# Patient Record
Sex: Male | Born: 1945 | Race: White | Hispanic: No | Smoking: Current some day smoker
Health system: Southern US, Community
[De-identification: ages and names within clinical notes are randomized; demographics above are authoritative.]

## PROBLEM LIST (undated history)

## (undated) DIAGNOSIS — H269 Unspecified cataract: Secondary | ICD-10-CM

## (undated) DIAGNOSIS — C801 Malignant (primary) neoplasm, unspecified: Secondary | ICD-10-CM

## (undated) DIAGNOSIS — M1990 Unspecified osteoarthritis, unspecified site: Secondary | ICD-10-CM

## (undated) DIAGNOSIS — F0390 Unspecified dementia without behavioral disturbance: Secondary | ICD-10-CM

## (undated) DIAGNOSIS — I10 Essential (primary) hypertension: Secondary | ICD-10-CM

## (undated) DIAGNOSIS — M810 Age-related osteoporosis without current pathological fracture: Secondary | ICD-10-CM

## (undated) DIAGNOSIS — K649 Unspecified hemorrhoids: Secondary | ICD-10-CM

## (undated) DIAGNOSIS — K219 Gastro-esophageal reflux disease without esophagitis: Secondary | ICD-10-CM

## (undated) DIAGNOSIS — G43909 Migraine, unspecified, not intractable, without status migrainosus: Secondary | ICD-10-CM

## (undated) DIAGNOSIS — F32A Depression, unspecified: Secondary | ICD-10-CM

## (undated) DIAGNOSIS — E785 Hyperlipidemia, unspecified: Secondary | ICD-10-CM

## (undated) DIAGNOSIS — F329 Major depressive disorder, single episode, unspecified: Secondary | ICD-10-CM

## (undated) DIAGNOSIS — F419 Anxiety disorder, unspecified: Secondary | ICD-10-CM

## (undated) DIAGNOSIS — E119 Type 2 diabetes mellitus without complications: Secondary | ICD-10-CM

## (undated) HISTORY — DX: Migraine, unspecified, not intractable, without status migrainosus: G43.909

## (undated) HISTORY — DX: Type 2 diabetes mellitus without complications: E11.9

## (undated) HISTORY — DX: Anxiety disorder, unspecified: F41.9

## (undated) HISTORY — DX: Unspecified osteoarthritis, unspecified site: M19.90

## (undated) HISTORY — DX: Unspecified cataract: H26.9

## (undated) HISTORY — DX: Depression, unspecified: F32.A

## (undated) HISTORY — DX: Gastro-esophageal reflux disease without esophagitis: K21.9

## (undated) HISTORY — DX: Malignant (primary) neoplasm, unspecified: C80.1

## (undated) HISTORY — DX: Unspecified hemorrhoids: K64.9

## (undated) HISTORY — DX: Unspecified dementia, unspecified severity, without behavioral disturbance, psychotic disturbance, mood disturbance, and anxiety: F03.90

## (undated) HISTORY — DX: Major depressive disorder, single episode, unspecified: F32.9

## (undated) HISTORY — DX: Hyperlipidemia, unspecified: E78.5

## (undated) HISTORY — DX: Essential (primary) hypertension: I10

## (undated) HISTORY — DX: Age-related osteoporosis without current pathological fracture: M81.0

---

## 2008-03-01 HISTORY — PX: COLONOSCOPY: SHX174

## 2014-10-28 ENCOUNTER — Encounter: Payer: Self-pay | Admitting: Family Medicine

## 2014-10-28 ENCOUNTER — Ambulatory Visit (INDEPENDENT_AMBULATORY_CARE_PROVIDER_SITE_OTHER): Payer: Medicare Other | Admitting: Family Medicine

## 2014-10-28 VITALS — BP 130/70 | HR 64 | Ht 75.0 in | Wt 241.0 lb

## 2014-10-28 DIAGNOSIS — J0110 Acute frontal sinusitis, unspecified: Secondary | ICD-10-CM

## 2014-10-28 DIAGNOSIS — J019 Acute sinusitis, unspecified: Secondary | ICD-10-CM | POA: Insufficient documentation

## 2014-10-28 MED ORDER — AMOXICILLIN-POT CLAVULANATE 875-125 MG PO TABS: 1.0000 | ORAL_TABLET | Freq: Two times a day (BID) | ORAL | Status: DC

## 2014-10-28 NOTE — Progress Notes (Addendum)
Name: Deforest Maiden   MRN: [id number]    DOB: April 29, 1945   Date:10/28/2014       Progress Note  Subjective  Chief Complaint  Chief Complaint  Patient presents with  . Ear Pain    R) ear- started hurting x 1 week    Otalgia  There is pain in the right ear. This is a chronic problem. The current episode started more than 1 year ago. The problem occurs constantly. The problem has been waxing and waning. The pain is mild. Associated symptoms include ear discharge, hearing loss and rhinorrhea. Pertinent negatives include no abdominal pain, coughing, diarrhea, headaches, neck pain, rash, sore throat or vomiting. He has tried nothing for the symptoms. His past medical history is significant for a chronic ear infection and hearing loss.    No problem-specific assessment & plan notes found for this encounter.   No past medical history on file.  Past Surgical History  Procedure Laterality Date  . Colonoscopy  2010    cleared for 10 yrs    No family history on file.  Social History   Social History  . Marital Status: Unknown    Spouse Name: N/A  . Number of Children: N/A  . Years of Education: N/A   Occupational History  . Not on file.   Social History Main Topics  . Smoking status: Current Every Day Smoker  . Smokeless tobacco: Not on file  . Alcohol Use: No  . Drug Use: No  . Sexual Activity: Not on file   Other Topics Concern  . Not on file   Social History Narrative  . No narrative on file    Allergies  Allergen Reactions  . Chantix [Varenicline]     depression     Review of Systems  Constitutional: Negative for fever, chills, weight loss and malaise/fatigue.  HENT: Positive for ear discharge, ear pain, hearing loss and rhinorrhea. Negative for sore throat.   Eyes: Negative for blurred vision.  Respiratory: Negative for cough, sputum production, shortness of breath and wheezing.   Cardiovascular: Negative for chest pain, palpitations and leg swelling.   Gastrointestinal: Negative for heartburn, nausea, vomiting, abdominal pain, diarrhea, constipation, blood in stool and melena.  Genitourinary: Negative for dysuria, urgency, frequency and hematuria.  Musculoskeletal: Negative for myalgias, back pain, joint pain and neck pain.  Skin: Negative for rash.  Neurological: Negative for dizziness, tingling, sensory change, focal weakness and headaches.  Endo/Heme/Allergies: Negative for environmental allergies and polydipsia. Does not bruise/bleed easily.  Psychiatric/Behavioral: Negative for depression and suicidal ideas. The patient is not nervous/anxious and does not have insomnia.      Objective  Filed Vitals:   10/28/14 1405  BP: 130/70  Pulse: 64  Height: 6\' 3"  (1.905 m)  Weight: 241 lb (109.317 kg)    Physical Exam  Constitutional: He is oriented to person, place, and time and well-developed, well-nourished, and in no distress.  HENT:  Head: Normocephalic.  Right Ear: External ear normal.  Left Ear: External ear normal.  Nose: Nose normal.  Mouth/Throat: Oropharynx is clear and moist.  Eyes: Conjunctivae and EOM are normal. Pupils are equal, round, and reactive to light. Right eye exhibits no discharge. Left eye exhibits no discharge. No scleral icterus.  Neck: Normal range of motion. Neck supple. No JVD present. No tracheal deviation present. No thyromegaly present.  Cardiovascular: Normal rate, regular rhythm, normal heart sounds and intact distal pulses.  Exam reveals no gallop and no friction rub.   No murmur  heard. Pulmonary/Chest: Breath sounds normal. No respiratory distress. He has no wheezes. He has no rales.  Abdominal: Soft. Bowel sounds are normal. He exhibits no mass. There is no hepatosplenomegaly. There is no tenderness. There is no rebound, no guarding and no CVA tenderness.  Musculoskeletal: Normal range of motion. He exhibits no edema or tenderness.  Lymphadenopathy:    He has no cervical adenopathy.   Neurological: He is alert and oriented to person, place, and time. He has normal sensation, normal strength, normal reflexes and intact cranial nerves. No cranial nerve deficit.  Skin: Skin is warm. No rash noted.  Psychiatric: Mood and affect normal.  Nursing note and vitals reviewed.     Assessment & Plan  Problem List Items Addressed This Visit      Respiratory   Sinusitis, acute - Primary   Relevant Medications   amoxicillin-clavulanate (AUGMENTIN) 875-125 MG per tablet        Dr. [facility]  10/28/2014

## 2015-06-30 ENCOUNTER — Ambulatory Visit (INDEPENDENT_AMBULATORY_CARE_PROVIDER_SITE_OTHER): Payer: Medicare Other | Admitting: Family Medicine

## 2015-06-30 ENCOUNTER — Encounter: Payer: Self-pay | Admitting: Family Medicine

## 2015-06-30 VITALS — BP 130/82 | HR 64 | Ht 75.0 in | Wt 227.0 lb

## 2015-06-30 DIAGNOSIS — J0110 Acute frontal sinusitis, unspecified: Secondary | ICD-10-CM

## 2015-06-30 DIAGNOSIS — I10 Essential (primary) hypertension: Secondary | ICD-10-CM | POA: Diagnosis not present

## 2015-06-30 DIAGNOSIS — T148XXA Other injury of unspecified body region, initial encounter: Secondary | ICD-10-CM

## 2015-06-30 MED ORDER — AMOXICILLIN-POT CLAVULANATE 875-125 MG PO TABS: 1.0000 | ORAL_TABLET | Freq: Two times a day (BID) | ORAL | Status: DC

## 2015-06-30 NOTE — Progress Notes (Signed)
Name: Jason Miranda   MRN: [id number]    DOB: [birth year]   Date:06/30/2015       Progress Note  Subjective  Chief Complaint  Chief Complaint  Patient presents with  . Hypertension    Hypertension This is a chronic problem. The current episode started more than 1 year ago. The problem has been waxing and waning since onset. The problem is controlled. Pertinent negatives include no anxiety, blurred vision, chest pain, headaches, malaise/fatigue, neck pain, orthopnea, palpitations, peripheral edema, PND, shortness of breath or sweats. There are no associated agents to hypertension. There are no known risk factors for coronary artery disease. Past treatments include calcium channel blockers.    No problem-specific assessment & plan notes found for this encounter.   No past medical history on file.  Past Surgical History  Procedure Laterality Date  . Colonoscopy  2010    cleared for 10 yrs    No family history on file.  Social History   Social History  . Marital Status: Unknown    Spouse Name: N/A  . Number of Children: N/A  . Years of Education: N/A   Occupational History  . Not on file.   Social History Main Topics  . Smoking status: Current Every Day Smoker  . Smokeless tobacco: Not on file  . Alcohol Use: No  . Drug Use: No  . Sexual Activity: Not on file   Other Topics Concern  . Not on file   Social History Narrative    Allergies  Allergen Reactions  . Chantix [Varenicline]     depression     Review of Systems  Constitutional: Negative for fever, chills, weight loss and malaise/fatigue.  HENT: Negative for ear discharge, ear pain and sore throat.   Eyes: Negative for blurred vision.  Respiratory: Negative for cough, sputum production, shortness of breath and wheezing.   Cardiovascular: Negative for chest pain, palpitations, orthopnea, leg swelling and PND.  Gastrointestinal: Negative for heartburn, nausea, abdominal pain, diarrhea, constipation, blood in  stool and melena.  Genitourinary: Negative for dysuria, urgency, frequency and hematuria.  Musculoskeletal: Negative for myalgias, back pain, joint pain and neck pain.  Skin: Negative for rash.  Neurological: Negative for dizziness, tingling, sensory change, focal weakness and headaches.  Endo/Heme/Allergies: Negative for environmental allergies and polydipsia. Does not bruise/bleed easily.  Psychiatric/Behavioral: Negative for depression and suicidal ideas. The patient is not nervous/anxious and does not have insomnia.      Objective  Filed Vitals:   06/30/15 1345  BP: 130/82  Pulse: 64  Height: 6\' 3"  (1.905 m)  Weight: 227 lb (102.967 kg)    Physical Exam  Constitutional: He is oriented to person, place, and time and well-developed, well-nourished, and in no distress.  HENT:  Head: Normocephalic.  Right Ear: External ear normal.  Left Ear: External ear normal.  Nose: Nose normal.  Mouth/Throat: Oropharynx is clear and moist.  Eyes: Conjunctivae and EOM are normal. Pupils are equal, round, and reactive to light. Right eye exhibits no discharge. Left eye exhibits no discharge. No scleral icterus.  Neck: Normal range of motion. Neck supple. No JVD present. No tracheal deviation present. No thyromegaly present.  Cardiovascular: Normal rate, regular rhythm, normal heart sounds and intact distal pulses.  Exam reveals no gallop and no friction rub.   No murmur heard. Pulmonary/Chest: Breath sounds normal. No respiratory distress. He has no wheezes. He has no rales.  Abdominal: Soft. Bowel sounds are normal. He exhibits no mass. There is no hepatosplenomegaly.  There is no tenderness. There is no rebound, no guarding and no CVA tenderness.  Musculoskeletal: Normal range of motion. He exhibits no edema or tenderness.  Lymphadenopathy:    He has no cervical adenopathy.  Neurological: He is alert and oriented to person, place, and time. He has normal sensation, normal strength, normal  reflexes and intact cranial nerves. No cranial nerve deficit.  Skin: Skin is warm. No rash noted.  Psychiatric: Mood and affect normal.  Nursing note and vitals reviewed.     Assessment & Plan  Problem List Items Addressed This Visit      Respiratory   Sinusitis, acute   Relevant Medications   terbinafine (LAMISIL) 250 MG tablet   amoxicillin-clavulanate (AUGMENTIN) 875-125 MG tablet    Other Visit Diagnoses    Essential hypertension    -  Primary    Abrasion        Relevant Medications    amoxicillin-clavulanate (AUGMENTIN) 875-125 MG tablet      Time spent with pt discussing meds and proper foot treatment for diabetics was 45 minutes   Dr. Deanna [facility]  06/30/2015

## 2015-09-12 ENCOUNTER — Other Ambulatory Visit: Payer: Self-pay | Admitting: Family Medicine

## 2015-11-25 ENCOUNTER — Other Ambulatory Visit: Payer: Self-pay

## 2015-11-25 MED ORDER — METFORMIN HCL 1000 MG PO TABS: 500.0000 mg | ORAL_TABLET | Freq: Two times a day (BID) | ORAL | 0 refills | Status: DC

## 2016-02-09 ENCOUNTER — Ambulatory Visit (INDEPENDENT_AMBULATORY_CARE_PROVIDER_SITE_OTHER): Payer: Medicare Other | Admitting: Family Medicine

## 2016-02-09 ENCOUNTER — Encounter: Payer: Self-pay | Admitting: Family Medicine

## 2016-02-09 VITALS — BP 130/80 | HR 68 | Ht 75.0 in | Wt 203.0 lb

## 2016-02-09 DIAGNOSIS — C159 Malignant neoplasm of esophagus, unspecified: Secondary | ICD-10-CM

## 2016-02-09 NOTE — Progress Notes (Signed)
Name: Jason Miranda   MRN: [id number]    DOB: [birth year]   Date:02/09/2016       Progress Note  Subjective  Chief Complaint  Chief Complaint  Patient presents with  . Foot Pain    plantar wart R) foot  . Advice Only    needs referral to cancer Doc as well as possible GI doc    Recently diagnosed esophageal [id number]) cancer by Dr Spero Curb.  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  On morphine er 15mg  q 12 hours because no longer has morphine patch.  Patient takes prochlorperazine for nausea.  Desires contact of specialist/oncologist to coordinate care/palliative / and possible initiation of hospice. Review of notes.  appt with Dr Grayland Ormond dec 13th at 3:00 /[facility].    No problem-specific Assessment & Plan notes found for this encounter.   Past Medical History:  Diagnosis Date  . Anxiety   . 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 GERD (gastroesophageal reflux disease)   . Hyperlipidemia   . Hypertension     Past Surgical History:  Procedure Laterality Date  . COLONOSCOPY  2010   cleared for 10 yrs    History reviewed. No pertinent family history.  Social History   Social History  . Marital status: Unknown    Spouse name: N/A  . Number of children: N/A  . Years of education: N/A   Occupational History  . Not on file.   Social History Main Topics  . Smoking status: Current Some Day Smoker  . Smokeless tobacco: Never Used  . Alcohol use No  . Drug use: No  . Sexual activity: Not on file   Other Topics Concern  . Not on file   Social History Narrative  . No narrative on file    Allergies  Allergen Reactions  . Chantix [Varenicline]     depression     Review of Systems  Constitutional: Negative for chills, fever, malaise/fatigue and weight loss.  HENT: Negative for ear discharge, ear pain and sore throat.   Eyes: Negative for blurred vision.   Respiratory: Negative for cough, sputum production, shortness of breath and wheezing.   Cardiovascular: Negative for chest pain, palpitations and leg swelling.  Gastrointestinal: Positive for abdominal pain and nausea. Negative for blood in stool, constipation, diarrhea, heartburn and melena.  Genitourinary: Positive for flank pain. Negative for dysuria, frequency, hematuria and urgency.  Musculoskeletal: Negative for back pain, joint pain, myalgias and neck pain.  Skin: Negative for rash.  Neurological: Negative for dizziness, tingling, sensory change, focal weakness and headaches.  Endo/Heme/Allergies: Negative for environmental allergies and polydipsia. Does not bruise/bleed easily.  Psychiatric/Behavioral: Negative for depression and suicidal ideas. The patient is not nervous/anxious and does not have insomnia.      Objective  Vitals:   02/09/16 1104  BP: 130/80  Pulse: 68  Weight: 203 lb (92.1 kg)  Height: 6\' 3"  (1.905 m)    Physical Exam  Constitutional: He is oriented to person, place, and time and well-developed, well-nourished, and in no distress.  HENT:  Head: Normocephalic.  Nose: Nose normal.  Eyes: Conjunctivae and EOM are normal. Pupils are equal, round, and reactive to light.  Neck: Normal range of motion. Neck supple.  Cardiovascular: Normal rate, regular rhythm and normal heart sounds.   Pulmonary/Chest: Effort normal and breath sounds normal. No respiratory distress.  Abdominal: Soft. Bowel sounds are normal. There is no  hepatosplenomegaly. There is no CVA tenderness.  Musculoskeletal: Normal range of motion.  Neurological: He is alert and oriented to person, place, and time. He has normal sensation, normal strength and intact cranial nerves.  Skin: Skin is warm. No rash noted.  Psychiatric: Mood and affect normal.  Nursing note and vitals reviewed.     Assessment & Plan  Problem List Items Addressed This Visit    None    Visit Diagnoses     Esophageal cancer, stage IV ([place])    -  Primary   Relevant Orders   Ambulatory referral to Oncology     More than 60 minutes was spent with the patient discussing medicine adjustment, hospice counseling, advanced care directives and cancer care   Dr. Otilio Miu [facility]  02/09/16

## 2016-02-10 ENCOUNTER — Ambulatory Visit: Payer: Medicare Other | Admitting: Family Medicine

## 2016-02-10 DIAGNOSIS — Z515 Encounter for palliative care: Secondary | ICD-10-CM | POA: Insufficient documentation

## 2016-02-10 NOTE — Progress Notes (Signed)
[place]  Telephone:([phone]) [phone] Fax:(336) [phone]  ID: Jason Miranda OB: Jun 04, 1945  MR#: [id number]  [id number]  Patient Care Team: Juline Patch, MD as PCP - General (Family Medicine)  CHIEF COMPLAINT: Encounter for hospice care, stage IV esophageal cancer.  INTERVAL HISTORY: Patient is a 70 year old male who was recently diagnosed with stage IV esophageal cancer with metastatic lesions in his liver and bone. He recently moved from [place] one week ago to be near family and is requesting enrollment in hospice care. Currently, he has significant pain and nausea. He has no neurologic complaints. He denies any fevers. He has a poor appetite and continues to lose weight. He has significant dysphasia. He has no chest pain or shortness of breath. He denies any diarrhea or constipation. He has no urinary complaints. Patient feels generally terrible, but offers no further specific complaints.  REVIEW OF SYSTEMS:   Review of Systems  Constitutional: Positive for malaise/fatigue and weight loss. Negative for fever.  Respiratory: Negative.  Negative for cough and shortness of breath.   Cardiovascular: Negative.  Negative for chest pain and leg swelling.  Gastrointestinal: Positive for nausea and vomiting. Negative for abdominal pain.  Genitourinary: Negative.   Musculoskeletal: Negative.   Neurological: Positive for weakness.  Psychiatric/Behavioral: Negative.     As per HPI. Otherwise, a complete review of systems is negative.  PAST MEDICAL HISTORY: Past Medical History:  Diagnosis Date  . Anxiety   . Arthritis   . 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 Cataract   . Dementia   . Depression   . Diabetes ([place])   . GERD (gastroesophageal reflux disease)   . Hemorrhoids   . Hyperlipidemia   . Hypertension   . Migraines   . Osteoporosis     PAST SURGICAL HISTORY: Past Surgical History:  Procedure  Laterality Date  . COLONOSCOPY  2010   cleared for 10 yrs    FAMILY HISTORY: History reviewed. No pertinent family history.  ADVANCED DIRECTIVES (Y/N):  N  HEALTH MAINTENANCE: Social History  Substance Use Topics  . Smoking status: Current Some Day Smoker  . Smokeless tobacco: Never Used  . Alcohol use No     Colonoscopy:  PAP:  Bone density:  Lipid panel:  Allergies  Allergen Reactions  . Chantix [Varenicline]     depression    Current Outpatient Prescriptions  Medication Sig Dispense Refill  . amLODipine (NORVASC) 5 MG tablet Take 10 mg by mouth daily.     Marland Kitchen aspirin EC 81 MG tablet Take 81 mg by mouth daily.    Marland Kitchen atorvastatin (LIPITOR) 20 MG tablet Take 40 mg by mouth daily.     . cetirizine (ZYRTEC) 10 MG tablet Take 10 mg by mouth daily.    . cyanocobalamin 1000 MCG tablet Take 1,000 mcg by mouth daily.    Marland Kitchen donepezil (ARICEPT) 10 MG tablet Take 10 mg by mouth at bedtime.    Marland Kitchen escitalopram (LEXAPRO) 10 MG tablet Take 10 mg by mouth daily.    . fluticasone (FLONASE) 50 MCG/ACT nasal spray Place 1 spray into both nostrils daily.    . Insulin Detemir (LEVEMIR FLEXPEN) 100 UNIT/ML Pen Inject 80 Units into the skin 2 (two) times daily. Sliding scale 60- 80 units daily    . lisinopril (PRINIVIL,ZESTRIL) 40 MG tablet Take 40 mg by mouth daily.    . Melatonin 3 MG CAPS Take 1 capsule by mouth at  bedtime.    . metFORMIN (GLUCOPHAGE) 500 MG tablet Take 500 mg by mouth 2 (two) times daily with a meal.    . morphine (MS CONTIN) 15 MG 12 hr tablet Take 15 mg by mouth every 12 (twelve) hours.    . Multiple Vitamin (MULTIVITAMIN) tablet Take 1 tablet by mouth daily.    . pantoprazole (PROTONIX) 20 MG tablet Take 20 mg by mouth daily.    . primidone (MYSOLINE) 50 MG tablet Take 50 mg by mouth 3 (three) times daily.    . fentaNYL (DURAGESIC - DOSED MCG/HR) 100 MCG/HR Place 1 patch (100 mcg total) onto the skin every 3 (three) days. 5 patch 0  . morphine (MS CONTIN) 30 MG 12 hr  tablet Take 1 tablet (30 mg total) by mouth every 8 (eight) hours. 45 tablet 0  . Morphine Sulfate (MORPHINE CONCENTRATE) 10 mg / 0.5 ml concentrated solution Take 1 mL (20 mg total) by mouth every 2 (two) hours as needed for severe pain. 120 mL 0   No current facility-administered medications for this visit.     OBJECTIVE: Vitals:   02/11/16 1525  BP: (!) 159/96  Pulse: 68  Resp: 18  Temp: 98.1 F (36.7 C)     Body mass index is 25.53 kg/m.    ECOG FS:0 - Asymptomatic  General: Ill-appearing, mild distress secondary to pain. Eyes: Pink conjunctiva, anicteric sclera. HEENT: Normocephalic, moist mucous membranes, clear oropharnyx. Lungs: Clear to auscultation bilaterally. Heart: Regular rate and rhythm. No rubs, murmurs, or gallops. Abdomen: Soft, nontender, nondistended. No organomegaly noted, normoactive bowel sounds. Musculoskeletal: No edema, cyanosis, or clubbing. Neuro: Alert, answering all questions appropriately. Cranial nerves grossly intact. Skin: No rashes or petechiae noted. Psych: Normal affect.   LAB RESULTS:  No results found for: NA, K, CL, CO2, GLUCOSE, BUN, CREATININE, CALCIUM, PROT, ALBUMIN, AST, ALT, ALKPHOS, BILITOT, GFRNONAA, GFRAA  No results found for: WBC, NEUTROABS, HGB, HCT, MCV, PLT   STUDIES: No results found.  ASSESSMENT: Encounter for hospice care, stage IV esophageal cancer.  PLAN:    1. Encounter for hospice care, stage IV esophageal cancer: Patient recently moved from [place] to be near family and enroll in hospice and comfort care. A referral has been sent. No further interventions are needed. No follow-up has been scheduled. 2. Pain: Patient was given a prescription for 100 g fentanyl patch, 30 mg MS Contin every 8 hours, and liquid Roxanol. 3. Nausea: Continue Compazine and Phenergan as needed.  Approximately 45 minutes was spent in discussion of which greater than 50% was consultation.  Patient expressed understanding and  was in agreement with this plan. He also understands that He can call clinic at any time with any questions, concerns, or complaints.   No matching staging information was found for the patient.  Lloyd Huger, MD   02/11/2016 5:04 PM

## 2016-02-11 ENCOUNTER — Encounter: Payer: Self-pay | Admitting: Oncology

## 2016-02-11 ENCOUNTER — Inpatient Hospital Stay: Payer: BLUE CROSS/BLUE SHIELD | Attending: Oncology | Admitting: Oncology

## 2016-02-11 VITALS — BP 159/96 | HR 68 | Temp 98.1°F | Resp 18 | Wt 204.3 lb

## 2016-02-11 DIAGNOSIS — R4702 Dysphasia: Secondary | ICD-10-CM | POA: Diagnosis not present

## 2016-02-11 DIAGNOSIS — E785 Hyperlipidemia, unspecified: Secondary | ICD-10-CM | POA: Insufficient documentation

## 2016-02-11 DIAGNOSIS — Z515 Encounter for palliative care: Secondary | ICD-10-CM

## 2016-02-11 DIAGNOSIS — C159 Malignant neoplasm of esophagus, unspecified: Secondary | ICD-10-CM | POA: Insufficient documentation

## 2016-02-11 DIAGNOSIS — R5381 Other malaise: Secondary | ICD-10-CM | POA: Insufficient documentation

## 2016-02-11 DIAGNOSIS — R531 Weakness: Secondary | ICD-10-CM | POA: Insufficient documentation

## 2016-02-11 DIAGNOSIS — E119 Type 2 diabetes mellitus without complications: Secondary | ICD-10-CM | POA: Insufficient documentation

## 2016-02-11 DIAGNOSIS — R5383 Other fatigue: Secondary | ICD-10-CM | POA: Insufficient documentation

## 2016-02-11 DIAGNOSIS — Z7982 Long term (current) use of aspirin: Secondary | ICD-10-CM | POA: Diagnosis not present

## 2016-02-11 DIAGNOSIS — F17210 Nicotine dependence, cigarettes, uncomplicated: Secondary | ICD-10-CM | POA: Insufficient documentation

## 2016-02-11 DIAGNOSIS — I10 Essential (primary) hypertension: Secondary | ICD-10-CM

## 2016-02-11 DIAGNOSIS — G893 Neoplasm related pain (acute) (chronic): Secondary | ICD-10-CM | POA: Diagnosis not present

## 2016-02-11 DIAGNOSIS — R112 Nausea with vomiting, unspecified: Secondary | ICD-10-CM

## 2016-02-11 DIAGNOSIS — R634 Abnormal weight loss: Secondary | ICD-10-CM | POA: Diagnosis not present

## 2016-02-11 DIAGNOSIS — K219 Gastro-esophageal reflux disease without esophagitis: Secondary | ICD-10-CM | POA: Diagnosis not present

## 2016-02-11 DIAGNOSIS — Z794 Long term (current) use of insulin: Secondary | ICD-10-CM

## 2016-02-11 DIAGNOSIS — M810 Age-related osteoporosis without current pathological fracture: Secondary | ICD-10-CM | POA: Diagnosis not present

## 2016-02-11 DIAGNOSIS — Z79899 Other long term (current) drug therapy: Secondary | ICD-10-CM | POA: Diagnosis not present

## 2016-02-11 DIAGNOSIS — C7889 Secondary malignant neoplasm of other digestive organs: Secondary | ICD-10-CM

## 2016-02-11 DIAGNOSIS — R630 Anorexia: Secondary | ICD-10-CM | POA: Diagnosis not present

## 2016-02-11 DIAGNOSIS — F0390 Unspecified dementia without behavioral disturbance: Secondary | ICD-10-CM | POA: Diagnosis not present

## 2016-02-11 DIAGNOSIS — C7951 Secondary malignant neoplasm of bone: Secondary | ICD-10-CM | POA: Diagnosis not present

## 2016-02-11 DIAGNOSIS — C787 Secondary malignant neoplasm of liver and intrahepatic bile duct: Secondary | ICD-10-CM | POA: Diagnosis not present

## 2016-02-11 MED ORDER — FENTANYL 100 MCG/HR TD PT72: 100.0000 ug | MEDICATED_PATCH | TRANSDERMAL | 0 refills | Status: DC

## 2016-02-11 MED ORDER — MORPHINE SULFATE ER 30 MG PO TBCR: 30.0000 mg | EXTENDED_RELEASE_TABLET | Freq: Three times a day (TID) | ORAL | 0 refills | Status: DC

## 2016-02-11 MED ORDER — MORPHINE SULFATE (CONCENTRATE) 10 MG /0.5 ML PO SOLN: 20.0000 mg | ORAL | 0 refills | Status: DC | PRN

## 2016-02-11 NOTE — Progress Notes (Signed)
New evaluation for stage IV esophageal cancer. Complains of intermittent pain in bones and sternum today.

## 2016-02-18 ENCOUNTER — Telehealth: Payer: Self-pay | Admitting: *Deleted

## 2016-02-18 MED ORDER — MORPHINE SULFATE (CONCENTRATE) 10 MG /0.5 ML PO SOLN: 20.0000 mg | ORAL | 0 refills | Status: DC | PRN

## 2016-02-18 NOTE — Telephone Encounter (Signed)
Pt called to ask if Dr. Grayland Ormond was going to schedule further follow up appointments or repeat scans. Pt is currently under hospice care and per Dr. Grayland Ormond further follow up is not required. Our office will continue to manage pt's symptoms through hospice services. Left voicemail message with patient regarding this information.

## 2016-02-18 NOTE — Telephone Encounter (Signed)
Hospice nurse had called earlier to report missing medication. Medication has been found she did request refill for Roxinol.     Prescritption signed and faxed to pharmacy.

## 2016-02-25 ENCOUNTER — Other Ambulatory Visit: Payer: Self-pay | Admitting: *Deleted

## 2016-02-25 MED ORDER — FENTANYL 100 MCG/HR TD PT72: 100.0000 ug | MEDICATED_PATCH | TRANSDERMAL | 0 refills | Status: DC

## 2016-02-25 MED ORDER — MORPHINE SULFATE ER 30 MG PO TBCR: 30.0000 mg | EXTENDED_RELEASE_TABLET | Freq: Three times a day (TID) | ORAL | 0 refills | Status: DC

## 2016-03-02 ENCOUNTER — Other Ambulatory Visit: Payer: Self-pay | Admitting: *Deleted

## 2016-03-02 MED ORDER — MORPHINE SULFATE (CONCENTRATE) 10 MG /0.5 ML PO SOLN: 20.0000 mg | ORAL | 0 refills | Status: AC | PRN

## 2016-03-04 NOTE — Progress Notes (Signed)
[name]  [phone] [phone]  ID: Jason Miranda OB: 07-Nov-1945  MR#: [id number]  [id number]  Patient Care Team: Juline Patch, MD as PCP - General (Family Medicine)  CHIEF COMPLAINT: Stage IV esophageal cancer.  INTERVAL HISTORY: Patient returns to clinic today for further evaluation and discussion of palliative treatment options. Despite his current narcotic regimen, he continues to have persistent back pain in a bandlike fashion. He also complains of "muscle spasms."  He continues to have a poor appetite. His weakness and fatigue have improved. He has no neurologic complaints. He denies any fevers. He has significant dysphasia. He has no chest pain or shortness of breath. He denies any diarrhea or constipation. He has no urinary complaints. Patient offers no further specific complaints.  REVIEW OF SYSTEMS:   Review of Systems  Constitutional: Positive for malaise/fatigue and weight loss. Negative for fever.  Respiratory: Negative.  Negative for cough and shortness of breath.   Cardiovascular: Negative.  Negative for chest pain and leg swelling.  Gastrointestinal: Positive for nausea and vomiting. Negative for abdominal pain.  Genitourinary: Negative.   Musculoskeletal: Positive for back pain and myalgias.  Neurological: Positive for weakness.  Psychiatric/Behavioral: Negative.     As per HPI. Otherwise, a complete review of systems is negative.  PAST MEDICAL HISTORY: Past Medical History:  Diagnosis Date  . Anxiety   . Arthritis   . 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 Cataract   . Dementia   . Depression   . Diabetes ([place])   . GERD (gastroesophageal reflux disease)   . Hemorrhoids   . Hyperlipidemia   . Hypertension   . Migraines   . Osteoporosis     PAST SURGICAL HISTORY: Past Surgical History:  Procedure Laterality Date  . COLONOSCOPY  2010   cleared for 10 yrs     FAMILY HISTORY: No family history on file.  ADVANCED DIRECTIVES (Y/N):  N  HEALTH MAINTENANCE: Social History  Substance Use Topics  . Smoking status: Current Some Day Smoker  . Smokeless tobacco: Never Used  . Alcohol use No     Colonoscopy:  PAP:  Bone density:  Lipid panel:  Allergies  Allergen Reactions  . Chantix [Varenicline]     depression    Current Outpatient Prescriptions  Medication Sig Dispense Refill  . cyanocobalamin 1000 MCG tablet Take 1,000 mcg by mouth daily.    Marland Kitchen donepezil (ARICEPT) 10 MG tablet Take 10 mg by mouth at bedtime.    Marland Kitchen escitalopram (LEXAPRO) 10 MG tablet Take 10 mg by mouth daily.    . fentaNYL (DURAGESIC - DOSED MCG/HR) 100 MCG/HR Place 1 patch (100 mcg total) onto the skin every 3 (three) days. 5 patch 0  . Insulin Detemir (LEVEMIR FLEXPEN) 100 UNIT/ML Pen Inject 80 Units into the skin 2 (two) times daily. Sliding scale 60- 80 units daily    . lisinopril (PRINIVIL,ZESTRIL) 40 MG tablet Take 40 mg by mouth daily.    . metFORMIN (GLUCOPHAGE) 500 MG tablet Take 500 mg by mouth 2 (two) times daily with a meal.    . morphine (MS CONTIN) 30 MG 12 hr tablet Take 1 tablet (30 mg total) by mouth every 8 (eight) hours. 45 tablet 0  . Morphine Sulfate (MORPHINE CONCENTRATE) 10 mg / 0.5 ml concentrated solution Take 1 mL (20 mg total) by mouth every 2 (two) hours as needed for severe pain. 120 mL 0  .  Multiple Vitamin (MULTIVITAMIN) tablet Take 1 tablet by mouth daily.    . pantoprazole (PROTONIX) 20 MG tablet Take 20 mg by mouth daily.    . primidone (MYSOLINE) 50 MG tablet Take 50 mg by mouth 3 (three) times daily.    Marland Kitchen amLODipine (NORVASC) 5 MG tablet Take 10 mg by mouth daily.     Marland Kitchen atorvastatin (LIPITOR) 20 MG tablet Take 40 mg by mouth daily.     . cyclobenzaprine (FLEXERIL) 10 MG tablet Take 1 tablet (10 mg total) by mouth 3 (three) times daily as needed for muscle spasms. 30 tablet 0   No current facility-administered medications for this  visit.     OBJECTIVE: Vitals:   03/05/16 1016  BP: (!) 161/86  Pulse: 73  Resp: 18  Temp: (!) 96.8 F (36 C)     Body mass index is 24.61 kg/m.    ECOG FS:2 - Symptomatic, <50% confined to bed  General: Ill-appearing, mild distress secondary to pain. Eyes: Pink conjunctiva, anicteric sclera. HEENT: Normocephalic, moist mucous membranes, clear oropharnyx. Lungs: Clear to auscultation bilaterally. Heart: Regular rate and rhythm. No rubs, murmurs, or gallops. Abdomen: Soft, nontender, nondistended. No organomegaly noted, normoactive bowel sounds. Musculoskeletal: No edema, cyanosis, or clubbing. Neuro: Alert, answering all questions appropriately. Cranial nerves grossly intact. Skin: No rashes or petechiae noted. Psych: Normal affect.   LAB RESULTS:  No results found for: NA, K, CL, CO2, GLUCOSE, BUN, CREATININE, CALCIUM, PROT, ALBUMIN, AST, ALT, ALKPHOS, BILITOT, GFRNONAA, GFRAA  No results found for: WBC, NEUTROABS, HGB, HCT, MCV, PLT   STUDIES: No results found.  ASSESSMENT: Stage IV esophageal cancer.  PLAN:    1. Stage IV esophageal cancer: Patient recently moved from [place] to be near family and enroll in hospice and comfort care. Continue hospice care as needed. Follow-up has been scheduled. 2. Pain: Continue 100 g fentanyl patch, 30 mg MS Contin every 8 hours, and liquid Roxanol. Because of patient's worsening back pain, will get a PET scan for further evaluation and a referral to radiation oncology for palliative XRT. 3. Nausea: Continue Compazine and Phenergan as needed.  4. Poor appetite: We will discuss case with dietary. 5. Muscle spasms: Patient was given a prescription for Flexeril today.  Patient also requested flu shot today.  Approximately 30 minutes was spent in discussion of which greater than 50% was consultation.  Patient expressed understanding and was in agreement with this plan. He also understands that He can call clinic at any time  with any questions, concerns, or complaints.   No matching staging information was found for the patient.  Lloyd Huger, MD   03/05/2016 2:45 PM

## 2016-03-05 ENCOUNTER — Inpatient Hospital Stay: Payer: BLUE CROSS/BLUE SHIELD | Attending: Oncology | Admitting: Oncology

## 2016-03-05 VITALS — BP 161/86 | HR 73 | Temp 96.8°F | Resp 18 | Wt 196.9 lb

## 2016-03-05 DIAGNOSIS — M62838 Other muscle spasm: Secondary | ICD-10-CM

## 2016-03-05 DIAGNOSIS — C7889 Secondary malignant neoplasm of other digestive organs: Secondary | ICD-10-CM | POA: Diagnosis not present

## 2016-03-05 DIAGNOSIS — Z79899 Other long term (current) drug therapy: Secondary | ICD-10-CM | POA: Diagnosis not present

## 2016-03-05 DIAGNOSIS — F0390 Unspecified dementia without behavioral disturbance: Secondary | ICD-10-CM | POA: Insufficient documentation

## 2016-03-05 DIAGNOSIS — E785 Hyperlipidemia, unspecified: Secondary | ICD-10-CM | POA: Insufficient documentation

## 2016-03-05 DIAGNOSIS — R531 Weakness: Secondary | ICD-10-CM | POA: Diagnosis not present

## 2016-03-05 DIAGNOSIS — R630 Anorexia: Secondary | ICD-10-CM | POA: Diagnosis not present

## 2016-03-05 DIAGNOSIS — Z794 Long term (current) use of insulin: Secondary | ICD-10-CM | POA: Insufficient documentation

## 2016-03-05 DIAGNOSIS — G8929 Other chronic pain: Secondary | ICD-10-CM | POA: Diagnosis not present

## 2016-03-05 DIAGNOSIS — R5381 Other malaise: Secondary | ICD-10-CM | POA: Diagnosis not present

## 2016-03-05 DIAGNOSIS — C159 Malignant neoplasm of esophagus, unspecified: Secondary | ICD-10-CM | POA: Insufficient documentation

## 2016-03-05 DIAGNOSIS — Z23 Encounter for immunization: Secondary | ICD-10-CM | POA: Insufficient documentation

## 2016-03-05 DIAGNOSIS — M549 Dorsalgia, unspecified: Secondary | ICD-10-CM | POA: Insufficient documentation

## 2016-03-05 DIAGNOSIS — C7951 Secondary malignant neoplasm of bone: Secondary | ICD-10-CM | POA: Insufficient documentation

## 2016-03-05 DIAGNOSIS — E119 Type 2 diabetes mellitus without complications: Secondary | ICD-10-CM | POA: Diagnosis not present

## 2016-03-05 DIAGNOSIS — C787 Secondary malignant neoplasm of liver and intrahepatic bile duct: Secondary | ICD-10-CM | POA: Insufficient documentation

## 2016-03-05 DIAGNOSIS — I10 Essential (primary) hypertension: Secondary | ICD-10-CM | POA: Diagnosis not present

## 2016-03-05 DIAGNOSIS — R5383 Other fatigue: Secondary | ICD-10-CM | POA: Insufficient documentation

## 2016-03-05 DIAGNOSIS — R112 Nausea with vomiting, unspecified: Secondary | ICD-10-CM | POA: Insufficient documentation

## 2016-03-05 DIAGNOSIS — F17210 Nicotine dependence, cigarettes, uncomplicated: Secondary | ICD-10-CM | POA: Insufficient documentation

## 2016-03-05 DIAGNOSIS — K219 Gastro-esophageal reflux disease without esophagitis: Secondary | ICD-10-CM | POA: Insufficient documentation

## 2016-03-05 MED ORDER — CYCLOBENZAPRINE HCL 10 MG PO TABS: 10.0000 mg | ORAL_TABLET | Freq: Three times a day (TID) | ORAL | 0 refills | Status: AC | PRN

## 2016-03-05 MED ORDER — INFLUENZA VAC SPLIT QUAD 0.5 ML IM SUSY
0.5000 mL | PREFILLED_SYRINGE | Freq: Once | INTRAMUSCULAR | Status: AC
  Administered 2016-03-05: 0.5 mL via INTRAMUSCULAR

## 2016-03-05 NOTE — Progress Notes (Signed)
Complains of increased bone pain that is not relieved by current pain medication. Pt requests prescription to help with bone pain.

## 2016-03-09 ENCOUNTER — Other Ambulatory Visit: Payer: Self-pay | Admitting: *Deleted

## 2016-03-09 ENCOUNTER — Telehealth: Payer: Self-pay | Admitting: *Deleted

## 2016-03-09 MED ORDER — MORPHINE SULFATE ER 30 MG PO TBCR: 30.0000 mg | EXTENDED_RELEASE_TABLET | Freq: Three times a day (TID) | ORAL | 0 refills | Status: AC

## 2016-03-09 MED ORDER — FENTANYL 50 MCG/HR TD PT72: 50.0000 ug | MEDICATED_PATCH | TRANSDERMAL | 0 refills | Status: DC

## 2016-03-09 NOTE — Telephone Encounter (Signed)
Patient having increased pain and asking about increasing his long acting meds. He is currently taking MS Contin 30 mg q 8 h and is on Fentanyl 100 mcg q 72 h. He is using Roxanol 20 mg 5 - 10 times a day. Please advise

## 2016-03-09 NOTE — Telephone Encounter (Signed)
VO Dr Grayland Ormond, increase Fentanyl to 150 mcg. Mitzi informed

## 2016-03-11 NOTE — Progress Notes (Signed)
Nutrition Assessment   Reason for Assessment:   MD Finnegan referred for phone call to patient regarding soft easy foods to swallow with esophageal cancer  ASSESSMENT:  72 year old male with stage IV esophageal cancer with metastatic disease.  Past medical history reviewed. Patient reports that he previously had a stent placed in esophageal area in [place].  Patient has recently moved to [place] to be close to family.  Plan is hospice care and palliative treatment options.    Was able to reach patient via phone this afternoon.  Patient reports that he has been eating cream of wheat mixed with yogurt, oatmeal with peaches, sherbet and drinking 2 ensure per day.  Nutrition Focused Physical Exam: unable to complete  Medications: levemir, metform, MVI, compazine, phenergan  Labs: reviewed  Anthropometrics:   Height: 75 inches Weight: 196 lb BMI: 24  Noted wt in 02/09/16 of 203 pounds  3% weight loss in the last month  NUTRITION DIAGNOSIS: Inadequate oral intake related to esophageal mass as evidenced by patient with limited intake of soft, liquidy foods and weight loss of 3 %   INTERVENTION:   Discussed puree/blending/grinding foods with patient.  Patient reports that he has a blender available for use.  Discussed soft, liquid foods high in protein and calories.   Encouraged patient to purchase oral nutrition supplement with at least 350 calories or more (currently using 220 calorie product).  Encouraged patient to drink as many supplements as he can drink for added calories and protein.  Discussed ways to blend supplements for even more calories and protein.   Will mail handouts, recipes and contact information.      MONITORING, EVALUATION, GOAL: Patient will consume soft, liquid foods that cause little to no pain.   NEXT VISIT: as needed.  Contact information mailed to patient  Deola Rewis B. Zenia Resides, [place], [place] (pager)

## 2016-03-12 ENCOUNTER — Ambulatory Visit
Admission: RE | Admit: 2016-03-12 | Discharge: 2016-03-12 | Disposition: A | Discharge status: Home / Self Care | Source: Ambulatory Visit | Attending: Oncology | Admitting: Oncology

## 2016-03-12 DIAGNOSIS — C787 Secondary malignant neoplasm of liver and intrahepatic bile duct: Secondary | ICD-10-CM | POA: Diagnosis not present

## 2016-03-12 DIAGNOSIS — C779 Secondary and unspecified malignant neoplasm of lymph node, unspecified: Secondary | ICD-10-CM | POA: Diagnosis not present

## 2016-03-12 DIAGNOSIS — C7951 Secondary malignant neoplasm of bone: Secondary | ICD-10-CM | POA: Insufficient documentation

## 2016-03-12 DIAGNOSIS — C159 Malignant neoplasm of esophagus, unspecified: Secondary | ICD-10-CM | POA: Diagnosis not present

## 2016-03-12 LAB — GLUCOSE, CAPILLARY: GLUCOSE-CAPILLARY: 92 mg/dL (ref 65–99)

## 2016-03-12 MED ORDER — FLUDEOXYGLUCOSE F - 18 (FDG) INJECTION
12.1900 | Freq: Once | INTRAVENOUS | Status: AC | PRN
  Administered 2016-03-12: 12.19 via INTRAVENOUS

## 2016-03-15 ENCOUNTER — Encounter: Payer: Self-pay | Admitting: Radiation Oncology

## 2016-03-15 ENCOUNTER — Ambulatory Visit
Admission: RE | Admit: 2016-03-15 | Discharge: 2016-03-15 | Disposition: A | Discharge status: Home / Self Care | Source: Ambulatory Visit | Attending: Radiation Oncology | Admitting: Radiation Oncology

## 2016-03-15 VITALS — BP 145/84 | HR 80 | Temp 96.1°F | Resp 18

## 2016-03-15 DIAGNOSIS — F329 Major depressive disorder, single episode, unspecified: Secondary | ICD-10-CM | POA: Insufficient documentation

## 2016-03-15 DIAGNOSIS — M129 Arthropathy, unspecified: Secondary | ICD-10-CM | POA: Diagnosis not present

## 2016-03-15 DIAGNOSIS — F419 Anxiety disorder, unspecified: Secondary | ICD-10-CM | POA: Diagnosis not present

## 2016-03-15 DIAGNOSIS — M818 Other osteoporosis without current pathological fracture: Secondary | ICD-10-CM | POA: Diagnosis not present

## 2016-03-15 DIAGNOSIS — K219 Gastro-esophageal reflux disease without esophagitis: Secondary | ICD-10-CM | POA: Insufficient documentation

## 2016-03-15 DIAGNOSIS — F17210 Nicotine dependence, cigarettes, uncomplicated: Secondary | ICD-10-CM | POA: Insufficient documentation

## 2016-03-15 DIAGNOSIS — I10 Essential (primary) hypertension: Secondary | ICD-10-CM | POA: Diagnosis not present

## 2016-03-15 DIAGNOSIS — E119 Type 2 diabetes mellitus without complications: Secondary | ICD-10-CM | POA: Insufficient documentation

## 2016-03-15 DIAGNOSIS — Z794 Long term (current) use of insulin: Secondary | ICD-10-CM | POA: Insufficient documentation

## 2016-03-15 DIAGNOSIS — Z8669 Personal history of other diseases of the nervous system and sense organs: Secondary | ICD-10-CM | POA: Insufficient documentation

## 2016-03-15 DIAGNOSIS — F0390 Unspecified dementia without behavioral disturbance: Secondary | ICD-10-CM | POA: Diagnosis not present

## 2016-03-15 DIAGNOSIS — C7951 Secondary malignant neoplasm of bone: Secondary | ICD-10-CM | POA: Insufficient documentation

## 2016-03-15 DIAGNOSIS — E785 Hyperlipidemia, unspecified: Secondary | ICD-10-CM | POA: Insufficient documentation

## 2016-03-15 DIAGNOSIS — Z79899 Other long term (current) drug therapy: Secondary | ICD-10-CM | POA: Insufficient documentation

## 2016-03-15 DIAGNOSIS — C159 Malignant neoplasm of esophagus, unspecified: Secondary | ICD-10-CM | POA: Diagnosis not present

## 2016-03-15 DIAGNOSIS — K649 Unspecified hemorrhoids: Secondary | ICD-10-CM | POA: Diagnosis not present

## 2016-03-15 DIAGNOSIS — G893 Neoplasm related pain (acute) (chronic): Secondary | ICD-10-CM | POA: Diagnosis not present

## 2016-03-15 DIAGNOSIS — C787 Secondary malignant neoplasm of liver and intrahepatic bile duct: Secondary | ICD-10-CM | POA: Insufficient documentation

## 2016-03-15 NOTE — Consult Note (Signed)
NEW PATIENT EVALUATION  Name: Jason Miranda  MRN: [id number]  Date:   03/15/2016     DOB: [birth year]   This 71 y.o. male patient presents to the clinic for initial evaluation of stage IV esophageal cancer with the structure T7 causing significant back pain.  REFERRING PHYSICIAN: Juline Patch, MD  CHIEF COMPLAINT:  Chief Complaint  Patient presents with  . Cancer    Pt is here for initial consultation of metastatic esophageal cancer.      DIAGNOSIS: The encounter diagnosis was Bone metastasis ([place]).   PREVIOUS INVESTIGATIONS:  PET CT scan reviewed Clinical notes reviewed  HPI: Patient is a 71 year old male diagnosed in [place] with stage IV esophageal carcinoma with liver and bone metastasis. He's been seen by hospice and is currently on narcotic analgesics although he continues to have significant pain in his back. He walks with some difficulty although specifically denies any motor or sensory level. He is currently on 100 g fentanyl patch and 30 mg of MS Contin every 8 hours with liquid Roxanol. PET scan was obtained showing destruction of T7 from hypermetabolic tumor which is the source of his pain. He also has progression of disease around the stent in his esophagus. He is seen today for consideration of palliative treatment.  PLANNED TREATMENT REGIMEN: Palliative radiation therapy to his thoracic spine  PAST MEDICAL HISTORY:  has a past medical history of Anxiety; Arthritis; Cancer ([place]); Cataract; Dementia; Depression; Diabetes ([place]); GERD (gastroesophageal reflux disease); Hemorrhoids; Hyperlipidemia; Hypertension; Migraines; and Osteoporosis.    PAST SURGICAL HISTORY:  Past Surgical History:  Procedure Laterality Date  . COLONOSCOPY  2010   cleared for 10 yrs    FAMILY HISTORY: family history is not on file.  SOCIAL HISTORY:  reports that he has been smoking.  He has never used smokeless tobacco. He reports that he does not drink alcohol or use  drugs.  ALLERGIES: Chantix [varenicline]  MEDICATIONS:  Current Outpatient Prescriptions  Medication Sig Dispense Refill  . amLODipine (NORVASC) 5 MG tablet Take 10 mg by mouth daily.     Marland Kitchen atorvastatin (LIPITOR) 20 MG tablet Take 40 mg by mouth daily.     . [employer] STOOL SOFTENER 8.6-50 MG tablet Take 2 tablets by mouth 2 (two) times daily.  3  . cyanocobalamin 1000 MCG tablet Take 1,000 mcg by mouth daily.    . cyclobenzaprine (FLEXERIL) 10 MG tablet Take 1 tablet (10 mg total) by mouth 3 (three) times daily as needed for muscle spasms. 30 tablet 0  . donepezil (ARICEPT) 10 MG tablet Take 10 mg by mouth at bedtime.    Marland Kitchen escitalopram (LEXAPRO) 10 MG tablet Take 10 mg by mouth daily.    . fentaNYL (DURAGESIC - DOSED MCG/HR) 100 MCG/HR Place 1 patch (100 mcg total) onto the skin every 3 (three) days. 5 patch 0  . fentaNYL (DURAGESIC - DOSED MCG/HR) 50 MCG/HR Place 1 patch (50 mcg total) onto the skin every 3 (three) days. Use with a 100 mcg patch for total dose of 150 mcg 5 patch 0  . Insulin Detemir (LEVEMIR FLEXPEN) 100 UNIT/ML Pen Inject 80 Units into the skin 2 (two) times daily. Sliding scale 60- 80 units daily    . lisinopril (PRINIVIL,ZESTRIL) 40 MG tablet Take 40 mg by mouth daily.    Marland Kitchen LORazepam (ATIVAN) 1 MG tablet TAKE 1 TABLET BY MOUTH EVERY 4 HOURS AS NEEDED FOR ANXIETY AND AGITATION  3  . metFORMIN (GLUCOPHAGE) 500 MG tablet Take 500  mg by mouth 2 (two) times daily with a meal.    . morphine (MS CONTIN) 30 MG 12 hr tablet Take 1 tablet (30 mg total) by mouth every 8 (eight) hours. 45 tablet 0  . Morphine Sulfate (MORPHINE CONCENTRATE) 10 mg / 0.5 ml concentrated solution Take 1 mL (20 mg total) by mouth every 2 (two) hours as needed for severe pain. 120 mL 0  . Multiple Vitamin (MULTIVITAMIN) tablet Take 1 tablet by mouth daily.    . pantoprazole (PROTONIX) 20 MG tablet Take 20 mg by mouth daily.    . primidone (MYSOLINE) 50 MG tablet Take 50 mg by mouth 3 (three) times daily.      No current facility-administered medications for this encounter.     ECOG PERFORMANCE STATUS:  2 - Symptomatic, <50% confined to bed  REVIEW OF SYSTEMS:  Patient denies any weight loss, fatigue, weakness, fever, chills or night sweats. Patient denies any loss of vision, blurred vision. Patient denies any ringing  of the ears or hearing loss. No irregular heartbeat. Patient denies heart murmur or history of fainting. Patient denies any chest pain or pain radiating to her upper extremities. Patient denies any shortness of breath, difficulty breathing at night, cough or hemoptysis. Patient denies any swelling in the lower legs. Patient denies any nausea vomiting, vomiting of blood, or coffee ground material in the vomitus. Patient denies any stomach pain. Patient states has had normal bowel movements no significant constipation or diarrhea. Patient denies any dysuria, hematuria or significant nocturia. Patient denies any problems walking, swelling in the joints or loss of balance. Patient denies any skin changes, loss of hair or loss of weight. Patient denies any excessive worrying or anxiety or significant depression. Patient denies any problems with insomnia. Patient denies excessive thirst, polyuria, polydipsia. Patient denies any swollen glands, patient denies easy bruising or easy bleeding. Patient denies any recent infections, allergies or URI. Patient "s visual fields have not changed significantly in recent time.    PHYSICAL EXAM: BP (!) 145/84   Pulse 80   Temp (!) 96.1 F (35.6 C)   Resp 94  Well-developed male in NAD. Deep palpation of his thoracic spine doesn't elicit pain. Motor sensory and DTR levels are equal and symmetric in the upper lower extremities. Proprioception is intact. Well-developed well-nourished patient in NAD. HEENT reveals PERLA, EOMI, discs not visualized.  Oral cavity is clear. No oral mucosal lesions are identified. Neck is clear without evidence of cervical or  supraclavicular adenopathy. Lungs are clear to A&P. Cardiac examination is essentially unremarkable with regular rate and rhythm without murmur rub or thrill. Abdomen is benign with no organomegaly or masses noted. Motor sensory and DTR levels are equal and symmetric in the upper and lower extremities. Cranial nerves II through XII are grossly intact. Proprioception is intact. No peripheral adenopathy or edema is identified. No motor or sensory levels are noted. Crude visual fields are within normal range.  LABORATORY DATA: No current labs for review    RADIOLOGY RESULTS: PET scan is reviewed showing significant disease in his liver as well as disease in his thoracic spine as described above.   IMPRESSION: Stage IV esophageal cancer with destruction of T7 vertebral body causing significant narcotic dependent pain in 71 year old male  PLAN: At this time like to go ahead with palliative ration therapy to his thoracic spine. Would incorporate the area of hypermetabolic tumor in his esophagus which is growing around his stent at this time. Would treat 3000 cGy  in 10 fractions. Hypofractionated course of treatment could not be performed based on the close proximity of esophagus which would cause significant radiation esophagitis. I've personally set up and ordered simulation later this week. Risks and benefits of treatment including skin reaction fatigue worsening of his swallowing and possible alteration of blood counts all were discussed with the patient and his family. They all seem to comprehend my treatment plan well.  I would like to take this opportunity to thank you for allowing me to participate in the care of your patient.Armstead Peaks., MD

## 2016-03-16 ENCOUNTER — Telehealth: Payer: Self-pay | Admitting: *Deleted

## 2016-03-16 ENCOUNTER — Other Ambulatory Visit: Payer: Self-pay | Admitting: *Deleted

## 2016-03-16 MED ORDER — FENTANYL 50 MCG/HR TD PT72: 50.0000 ug | MEDICATED_PATCH | TRANSDERMAL | 0 refills | Status: AC

## 2016-03-16 MED ORDER — FENTANYL 100 MCG/HR TD PT72: 100.0000 ug | MEDICATED_PATCH | TRANSDERMAL | 0 refills | Status: AC

## 2016-03-16 NOTE — Telephone Encounter (Signed)
Advised Mitzi to contact ordering provider, PCP or Med Director for changes to this med

## 2016-03-16 NOTE — Telephone Encounter (Signed)
Asking for Mirtazapine for appetite Per VO Dr Grayland Ormond OK to order Mirtazipine. Mitzi informed. Fentanyl faxed

## 2016-03-16 NOTE — Telephone Encounter (Signed)
I am not familiar with this med.  Can he call who prescribed it?

## 2016-03-16 NOTE — Telephone Encounter (Signed)
Patient has essential Tremors and wants to paint one last picture for his family, he is on Primidone 50 mg TID and she is asking if that can be increased. Please advise

## 2016-03-17 ENCOUNTER — Ambulatory Visit
Admission: RE | Admit: 2016-03-17 | Discharge: 2016-03-17 | Disposition: A | Discharge status: Home / Self Care | Source: Ambulatory Visit | Attending: Radiation Oncology | Admitting: Radiation Oncology

## 2016-03-17 DIAGNOSIS — C7951 Secondary malignant neoplasm of bone: Secondary | ICD-10-CM | POA: Diagnosis not present

## 2016-03-19 ENCOUNTER — Emergency Department
Admission: EM | Admit: 2016-03-19 | Discharge: 2016-03-19 | Disposition: A | Discharge status: Home / Self Care | Attending: Emergency Medicine | Admitting: Emergency Medicine

## 2016-03-19 ENCOUNTER — Encounter: Payer: Self-pay | Admitting: Emergency Medicine

## 2016-03-19 DIAGNOSIS — Z79899 Other long term (current) drug therapy: Secondary | ICD-10-CM | POA: Insufficient documentation

## 2016-03-19 DIAGNOSIS — C158 Malignant neoplasm of overlapping sites of esophagus: Secondary | ICD-10-CM | POA: Diagnosis not present

## 2016-03-19 DIAGNOSIS — I10 Essential (primary) hypertension: Secondary | ICD-10-CM | POA: Diagnosis not present

## 2016-03-19 DIAGNOSIS — E119 Type 2 diabetes mellitus without complications: Secondary | ICD-10-CM | POA: Insufficient documentation

## 2016-03-19 DIAGNOSIS — R52 Pain, unspecified: Secondary | ICD-10-CM | POA: Diagnosis present

## 2016-03-19 DIAGNOSIS — Z794 Long term (current) use of insulin: Secondary | ICD-10-CM | POA: Insufficient documentation

## 2016-03-19 DIAGNOSIS — F17200 Nicotine dependence, unspecified, uncomplicated: Secondary | ICD-10-CM | POA: Diagnosis not present

## 2016-03-19 LAB — URINALYSIS, COMPLETE (UACMP) WITH MICROSCOPIC
BACTERIA UA: NONE SEEN
Bilirubin Urine: NEGATIVE
GLUCOSE, UA: NEGATIVE mg/dL
Hgb urine dipstick: NEGATIVE
Ketones, ur: 5 mg/dL — AB
Leukocytes, UA: NEGATIVE
NITRITE: NEGATIVE
PROTEIN: NEGATIVE mg/dL
Specific Gravity, Urine: 1.024 (ref 1.005–1.030)
pH: 6 (ref 5.0–8.0)

## 2016-03-19 LAB — CBC WITH DIFFERENTIAL/PLATELET
Basophils Absolute: 0 10*3/uL (ref 0–0.1)
Basophils Relative: 0 %
Eosinophils Absolute: 0.1 10*3/uL (ref 0–0.7)
Eosinophils Relative: 1 %
HCT: 41.1 % (ref 40.0–52.0)
HEMOGLOBIN: 13.8 g/dL (ref 13.0–18.0)
LYMPHS ABS: 1.6 10*3/uL (ref 1.0–3.6)
LYMPHS PCT: 12 %
MCH: 28.6 pg (ref 26.0–34.0)
MCHC: 33.5 g/dL (ref 32.0–36.0)
MCV: 85.4 fL (ref 80.0–100.0)
Monocytes Absolute: 1.1 10*3/uL — ABNORMAL HIGH (ref 0.2–1.0)
Monocytes Relative: 8 %
NEUTROS ABS: 10.7 10*3/uL — AB (ref 1.4–6.5)
NEUTROS PCT: 79 %
Platelets: 335 10*3/uL (ref 150–440)
RBC: 4.81 MIL/uL (ref 4.40–5.90)
RDW: 17.4 % — ABNORMAL HIGH (ref 11.5–14.5)
WBC: 13.6 10*3/uL — AB (ref 3.8–10.6)

## 2016-03-19 LAB — COMPREHENSIVE METABOLIC PANEL
ALK PHOS: 192 U/L — AB (ref 38–126)
ALT: 10 U/L — AB (ref 17–63)
AST: 42 U/L — ABNORMAL HIGH (ref 15–41)
Albumin: 2.8 g/dL — ABNORMAL LOW (ref 3.5–5.0)
Anion gap: 8 (ref 5–15)
BUN: 8 mg/dL (ref 6–20)
CALCIUM: 9.1 mg/dL (ref 8.9–10.3)
CO2: 26 mmol/L (ref 22–32)
Chloride: 100 mmol/L — ABNORMAL LOW (ref 101–111)
Creatinine, Ser: 0.46 mg/dL — ABNORMAL LOW (ref 0.61–1.24)
GFR calc non Af Amer: >60 mL/min (ref >60)
Glucose, Bld: 92 mg/dL (ref 65–99)
Potassium: 3.6 mmol/L (ref 3.5–5.1)
SODIUM: 134 mmol/L — AB (ref 135–145)
Total Bilirubin: 0.8 mg/dL (ref 0.3–1.2)
Total Protein: 6.8 g/dL (ref 6.5–8.1)

## 2016-03-19 MED ORDER — SODIUM CHLORIDE 0.9 % IV BOLUS (SEPSIS)
1000.0000 mL | Freq: Once | INTRAVENOUS | Status: AC
  Administered 2016-03-19: 1000 mL via INTRAVENOUS

## 2016-03-19 MED ORDER — HYDROMORPHONE HCL 1 MG/ML IJ SOLN
2.0000 mg | Freq: Once | INTRAMUSCULAR | Status: AC
  Administered 2016-03-19: 2 mg via INTRAVENOUS
  Filled 2016-03-19: qty 2

## 2016-03-19 MED ORDER — FENTANYL CITRATE (PF) 100 MCG/2ML IJ SOLN
50.0000 ug | Freq: Once | INTRAMUSCULAR | Status: AC
  Administered 2016-03-19: 50 ug via INTRAVENOUS
  Filled 2016-03-19: qty 2

## 2016-03-19 MED ORDER — FENTANYL 100 MCG/HR TD PT72
200.0000 ug | MEDICATED_PATCH | Freq: Once | TRANSDERMAL | Status: DC
  Administered 2016-03-19: 200 ug via TRANSDERMAL
  Filled 2016-03-19: qty 2

## 2016-03-19 MED ORDER — HYDROMORPHONE HCL 1 MG/ML IJ SOLN
1.0000 mg | Freq: Once | INTRAMUSCULAR | Status: AC
  Administered 2016-03-19: 1 mg via INTRAVENOUS
  Filled 2016-03-19: qty 1

## 2016-03-19 NOTE — ED Triage Notes (Addendum)
Patient arrives from home with c/o generalized pain. Patient is an esophageal cancer patient who recently moved from [facility] seeking better [place] care with the [facility]. Patient has an appointment scheduled to begin treatment 03/23/16 and is currently being seen by Hospice. Patient arrives at the request of hospice for pain control. Patient noted to have taken liquid morphine, tablet morphine, and currently has 3x 85mcg fentanyl patches in place

## 2016-03-19 NOTE — ED Notes (Signed)
Patient c/o of generalized pain on initial assessment. All other systems intact, no other complaints, Ambulatory without difficulty

## 2016-03-19 NOTE — Progress Notes (Signed)
ED visit made. Patient is currently followed by Hospice and [place] at home with a diagnosis of esophogeal cancer. He is a Full Code. Patient came to the ED today for evaluation of uncontrolled pain. At home he was on Fentanyl 150 mcg, MS Contin 30 MG BID and Liquid morphine 20 mg Q 2 hrs for breakthrough. He has received IV dilaudid 3 mg since admission.  Patient seen sitting on the ED stretcher, alert and oriented, continues to c/o pain with a movement. Writer spoke with Mr. Jason Miranda and his niece [name] about a transfer to the hospice home for pain management. They are both in agreement. Hospital care team aware. Writer to call report to the hospice home and contact EMS when patient is ready for transport. Thank you.  Berton Lan, [facility] [place], [facility] [phone] c

## 2016-03-19 NOTE — Progress Notes (Signed)
Code status readdressed with Jason Miranda, at this time he remains a full code. Plan is for transport to the hospice home for PAIN management. EMS notified for transport. Patient, his niece [name] and hospital care team all aware. Thank you. Flo Shanks RN, BSN. [facility] [place], hospital Liaison [phone] c

## 2016-03-19 NOTE — ED Provider Notes (Signed)
[facility] Provider Note   CSN: [id number] Arrival date & time: 03/19/16  0751     History   Chief Complaint Chief Complaint  Patient presents with  . Generalized Pain    HPI Jason Miranda is a 71 y.o. 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 Patient states that he just moved here from [place] about month ago. 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 He saw Dr. Grayland Ormond, oncologist here about a week ago. Patient was put on fentanyl patch 150 mcg, 30 mg MS contin Q 8 hrs, liquid roxanol for break through pain. Denies fever, chest pain, food stuck in esophagus. Scheduled for palliative radiation next week.   The history is provided by the patient.    Past Medical History:  Diagnosis Date  . Anxiety   . Arthritis   . Cancer [facility])    adenocarcinoma of the esophagus/ metastatic to bone, liver and stomach  . Cataract   . Dementia   . Depression   . Diabetes ([place])   . GERD (gastroesophageal reflux disease)   . Hemorrhoids   . Hyperlipidemia   . Hypertension   . Migraines   . Osteoporosis     Patient Active Problem List   Diagnosis Date Noted  . Esophageal cancer, stage IV ([place]) 02/11/2016  . Encounter for hospice care 02/10/2016  . Sinusitis, acute 10/28/2014    Past Surgical History:  Procedure Laterality Date  . COLONOSCOPY  2010   cleared for 10 yrs       Home Medications    Prior to Admission medications   Medication Sig Start Date End Date Taking? Authorizing Provider  amLODipine (NORVASC) 5 MG tablet Take 10 mg by mouth daily.     Historical Provider, MD  atorvastatin (LIPITOR) 20 MG tablet Take 40 mg by mouth daily.     Historical Provider, MD  [employer] STOOL SOFTENER 8.6-50 MG tablet Take 2 tablets by mouth 2 (two) times daily. 03/02/16   Historical Provider, MD  cyanocobalamin 1000 MCG tablet Take 1,000 mcg by mouth daily.     Historical Provider, MD  cyclobenzaprine (FLEXERIL) 10 MG tablet Take 1 tablet (10 mg total) by mouth 3 (three) times daily as needed for muscle spasms. 03/05/16   Lloyd Huger, MD  donepezil (ARICEPT) 10 MG tablet Take 10 mg by mouth at bedtime.    Historical Provider, MD  escitalopram (LEXAPRO) 10 MG tablet Take 10 mg by mouth daily.    Historical Provider, MD  fentaNYL (DURAGESIC - DOSED MCG/HR) 100 MCG/HR Place 1 patch (100 mcg total) onto the skin every 3 (three) days. Use with a 50 mcg patch for total dose of 150 mcg 03/16/16   Lloyd Huger, MD  fentaNYL (DURAGESIC - DOSED MCG/HR) 50 MCG/HR Place 1 patch (50 mcg total) onto the skin every 3 (three) days. Use with a 100 mcg patch for total dose of 150 mcg 03/16/16   Lloyd Huger, MD  Insulin Detemir (LEVEMIR FLEXPEN) 100 UNIT/ML Pen Inject 80 Units into the skin 2 (two) times daily. Sliding scale 60- 80 units daily    Historical Provider, MD  lisinopril (PRINIVIL,ZESTRIL) 40 MG tablet Take 40 mg by mouth daily.    Historical Provider, MD  LORazepam (ATIVAN) 1 MG tablet TAKE 1 TABLET BY MOUTH EVERY 4 HOURS AS NEEDED FOR ANXIETY AND AGITATION 03/09/16   Historical Provider, MD  metFORMIN (GLUCOPHAGE)  500 MG tablet Take 500 mg by mouth 2 (two) times daily with a meal.    Historical Provider, MD  morphine (MS CONTIN) 30 MG 12 hr tablet Take 1 tablet (30 mg total) by mouth every 8 (eight) hours. 03/09/16   Lloyd Huger, MD  Morphine Sulfate (MORPHINE CONCENTRATE) 10 mg / 0.5 ml concentrated solution Take 1 mL (20 mg total) by mouth every 2 (two) hours as needed for severe pain. 03/02/16   Lloyd Huger, MD  Multiple Vitamin (MULTIVITAMIN) tablet Take 1 tablet by mouth daily.    Historical Provider, MD  pantoprazole (PROTONIX) 20 MG tablet Take 20 mg by mouth daily.    Historical Provider, MD  primidone (MYSOLINE) 50 MG tablet Take 50 mg by mouth 3 (three) times daily.    Historical Provider, MD    Family History History reviewed.  No pertinent family history.  Social History Social History  Substance Use Topics  . Smoking status: Current Some Day Smoker  . Smokeless tobacco: Never Used  . Alcohol use No     Allergies   Chantix [varenicline]   Review of Systems Review of Systems  Musculoskeletal: Positive for back pain.  All other systems reviewed and are negative.    Physical Exam Updated Vital Signs BP (!) 148/85   Pulse 82   Temp 98.7 F (37.1 C) (Oral)   Resp 16   Wt 191 lb (86.6 kg)   SpO2 95%   BMI 23.87 kg/m   Physical Exam  Constitutional: He is oriented to person, place, and time.  Chronically ill, uncomfortable   HENT:  Head: Normocephalic.  MM slightly dry   Eyes: EOM are normal. Pupils are equal, round, and reactive to light.  Neck: Normal range of motion. Neck supple.  Cardiovascular: Normal rate, regular rhythm and normal heart sounds.   Pulmonary/Chest: Effort normal and breath sounds normal. No respiratory distress. He has no wheezes.  Abdominal: Soft. Bowel sounds are normal. He exhibits no distension. There is no tenderness. There is no guarding.  Musculoskeletal: Normal range of motion.  Neurological: He is alert and oriented to person, place, and time. No cranial nerve deficit. Coordination normal.  Skin: Skin is warm.  Psychiatric: He has a normal mood and affect.  Nursing note and vitals reviewed.    ED Treatments / Results  Labs (all labs ordered are listed, but only abnormal results are displayed) Labs Reviewed  CBC WITH DIFFERENTIAL/PLATELET - Abnormal; Notable for the following:       Result Value   WBC 13.6 (*)    RDW 17.4 (*)    Neutro Abs 10.7 (*)    Monocytes Absolute 1.1 (*)    All other components within normal limits  COMPREHENSIVE METABOLIC PANEL - Abnormal; Notable for the following:    Sodium 134 (*)    Chloride 100 (*)    Creatinine, Ser 0.46 (*)    Albumin 2.8 (*)    AST 42 (*)    ALT 10 (*)    Alkaline Phosphatase 192 (*)    All other  components within normal limits  URINALYSIS, COMPLETE (UACMP) WITH MICROSCOPIC - Abnormal; Notable for the following:    Color, Urine AMBER (*)    APPearance CLEAR (*)    Ketones, ur 5 (*)    Squamous Epithelial / LPF 0-5 (*)    All other components within normal limits    EKG  EKG Interpretation None       Radiology No results found.  Procedures Procedures (including critical care time)  Medications Ordered in ED Medications  fentaNYL (DURAGESIC - dosed mcg/hr) 200 mcg (200 mcg Transdermal Patch Applied 03/19/16 0840)  fentaNYL (SUBLIMAZE) injection 50 mcg (not administered)  sodium chloride 0.9 % bolus 1,000 mL (0 mLs Intravenous Stopped 03/19/16 1026)  HYDROmorphone (DILAUDID) injection 1 mg (1 mg Intravenous Given 03/19/16 0827)  sodium chloride 0.9 % bolus 1,000 mL (1,000 mLs Intravenous New Bag/Given 03/19/16 1026)  HYDROmorphone (DILAUDID) injection 2 mg (2 mg Intravenous Given 03/19/16 1113)     Initial Impression / Assessment and Plan / ED Course  I have reviewed the triage vital signs and the nursing notes.  Pertinent labs & imaging results that were available during my care of the patient were reviewed by me and considered in my medical decision making (see chart for details).    Jason Miranda is a 71 y.o. male hx of metastatic cancer here with worsening of chronic pain. Already on fentanyl patch, MS contin, roxanol. Will check labs, given IV pain meds. May need to increase pain meds for pain control.   1:27 PM Labs showed LFTs slightly elevated consistent with known liver mets. UA unremarkable. I increased fentanyl patch to 200 mcg from 150 mcg. Given multiple rounds of pain meds and pain improved. Hospice nurse came to the ED and offered to go to inpatient hospice for pain control. There is a bed at hospice and will transfer patient there.   Final Clinical Impressions(s) / ED Diagnoses   Final diagnoses:  None    New Prescriptions New Prescriptions   No  medications on file     Drenda Freeze, MD 03/19/16 1328

## 2016-03-19 NOTE — Discharge Instructions (Signed)
Go to Hospice for pain control.   See your radiation oncologist on Tuesday   Return to ER if you have fever, uncontrolled pain, vomiting.

## 2016-03-21 ENCOUNTER — Emergency Department
Admission: EM | Admit: 2016-03-21 | Discharge: 2016-03-21 | Disposition: A | Discharge status: Home / Self Care | Attending: Emergency Medicine | Admitting: Emergency Medicine

## 2016-03-21 ENCOUNTER — Emergency Department

## 2016-03-21 ENCOUNTER — Other Ambulatory Visit: Payer: Self-pay

## 2016-03-21 DIAGNOSIS — Z8501 Personal history of malignant neoplasm of esophagus: Secondary | ICD-10-CM | POA: Insufficient documentation

## 2016-03-21 DIAGNOSIS — Z79899 Other long term (current) drug therapy: Secondary | ICD-10-CM | POA: Diagnosis not present

## 2016-03-21 DIAGNOSIS — R079 Chest pain, unspecified: Secondary | ICD-10-CM | POA: Diagnosis not present

## 2016-03-21 DIAGNOSIS — R0602 Shortness of breath: Secondary | ICD-10-CM | POA: Diagnosis not present

## 2016-03-21 DIAGNOSIS — Z794 Long term (current) use of insulin: Secondary | ICD-10-CM | POA: Diagnosis not present

## 2016-03-21 DIAGNOSIS — F17210 Nicotine dependence, cigarettes, uncomplicated: Secondary | ICD-10-CM | POA: Insufficient documentation

## 2016-03-21 DIAGNOSIS — E119 Type 2 diabetes mellitus without complications: Secondary | ICD-10-CM | POA: Diagnosis not present

## 2016-03-21 DIAGNOSIS — I10 Essential (primary) hypertension: Secondary | ICD-10-CM | POA: Diagnosis not present

## 2016-03-21 DIAGNOSIS — Z8505 Personal history of malignant neoplasm of liver: Secondary | ICD-10-CM | POA: Insufficient documentation

## 2016-03-21 DIAGNOSIS — Z85830 Personal history of malignant neoplasm of bone: Secondary | ICD-10-CM | POA: Insufficient documentation

## 2016-03-21 DIAGNOSIS — Z85028 Personal history of other malignant neoplasm of stomach: Secondary | ICD-10-CM | POA: Insufficient documentation

## 2016-03-21 LAB — CBC
HEMATOCRIT: 36.4 % — AB (ref 40.0–52.0)
HEMOGLOBIN: 12.8 g/dL — AB (ref 13.0–18.0)
MCH: 29.6 pg (ref 26.0–34.0)
MCHC: 35.1 g/dL (ref 32.0–36.0)
MCV: 84.4 fL (ref 80.0–100.0)
Platelets: 312 10*3/uL (ref 150–440)
RBC: 4.32 MIL/uL — ABNORMAL LOW (ref 4.40–5.90)
RDW: 17.5 % — ABNORMAL HIGH (ref 11.5–14.5)
WBC: 10.7 10*3/uL — AB (ref 3.8–10.6)

## 2016-03-21 LAB — BASIC METABOLIC PANEL
ANION GAP: 7 (ref 5–15)
BUN: 5 mg/dL — AB (ref 6–20)
CO2: 27 mmol/L (ref 22–32)
Calcium: 8.7 mg/dL — ABNORMAL LOW (ref 8.9–10.3)
Chloride: 100 mmol/L — ABNORMAL LOW (ref 101–111)
Creatinine, Ser: 0.41 mg/dL — ABNORMAL LOW (ref 0.61–1.24)
GFR calc Af Amer: >60 mL/min (ref >60)
GLUCOSE: 144 mg/dL — AB (ref 65–99)
POTASSIUM: 3.5 mmol/L (ref 3.5–5.1)
Sodium: 134 mmol/L — ABNORMAL LOW (ref 135–145)

## 2016-03-21 LAB — TROPONIN I: Troponin I: <0.03 ng/mL (ref <0.03)

## 2016-03-21 MED ORDER — MORPHINE SULFATE 10 MG/5ML PO SOLN
20.0000 mg | ORAL | Status: DC | PRN
  Administered 2016-03-21: 20 mg via ORAL
  Filled 2016-03-21: qty 10

## 2016-03-21 NOTE — ED Notes (Signed)
Pt ambulated to restroom with no assistance, steady gait noted.

## 2016-03-21 NOTE — ED Notes (Signed)
Dr. Clayborn Bigness at bedside with Dr. Cinda Quest

## 2016-03-21 NOTE — ED Notes (Signed)
Family at bedside. Pt awaiting EDP.

## 2016-03-21 NOTE — ED Triage Notes (Signed)
Pt arrives to ER via [facility] from hospice for CP that occurred earlier today. Pt took 2 SL nitro and CP relieved. EKG for EMS normal. No CP at this time. 20G to L forearm.

## 2016-03-21 NOTE — Discharge Instructions (Signed)
Please follow-up with [facility], the cardiologist tomorrow. Call his office first thing in the morning he should be able to see her tomorrow or the next at the latest. Please return here for any further chest pain.

## 2016-03-21 NOTE — ED Provider Notes (Signed)
[facility] Emergency Department Provider Note   ____________________________________________   First MD Initiated Contact with Patient 03/21/16 1501     (approximate)  I have reviewed the triage vital signs and the nursing notes.   HISTORY  Chief Complaint Chest Pain    HPI Jason Miranda is a 71 y.o. male from [place] who recently moved here to get better treatment for his cancer at [facility] reports he was in hospice smoking in the gazebo and as he was walking back from 0 after finishing smoking his cigarette he began getting pain in the center of his chest that radiated up towards his left shoulder. It got to be very intense seemed to be somewhat burning in nature was not associated with shortness of breath. He was nauseated but he has been nauseated frequently recently. He sweated after the pain began to improve. He is now feeling okay. He has no further pain or any other symptoms. He did have a stress test at [facility] in 2009 which was negative. I cannot find any records of him having anything else since then. He does not remember anything being evaluated and his heart since then either.   Past Medical History:  Diagnosis Date  . Anxiety   . Arthritis   . Cancer [facility])    adenocarcinoma of the esophagus/ metastatic to bone, liver and stomach  . Cataract   . Dementia   . Depression   . Diabetes ([place])   . GERD (gastroesophageal reflux disease)   . Hemorrhoids   . Hyperlipidemia   . Hypertension   . Migraines   . Osteoporosis     Patient Active Problem List   Diagnosis Date Noted  . Esophageal cancer, stage IV ([place]) 02/11/2016  . Encounter for hospice care 02/10/2016  . Sinusitis, acute 10/28/2014    Past Surgical History:  Procedure Laterality Date  . COLONOSCOPY  2010   cleared for 10 yrs    Prior to Admission medications   Medication Sig Start Date End Date Taking? Authorizing Provider  acetaminophen  (TYLENOL) 500 MG tablet Take 500-1,000 mg by mouth every 4 (four) hours as needed.   Yes Historical Provider, MD  amLODipine (NORVASC) 10 MG tablet Take 10 mg by mouth daily.    Yes Historical Provider, MD  bisacodyl (DULCOLAX) 10 MG suppository Place 10 mg rectally as needed for moderate constipation.   Yes Historical Provider, MD  [employer] STOOL SOFTENER 8.6-50 MG tablet Take 1-3 tablets by mouth 2 (two) times daily.  03/02/16  Yes Historical Provider, MD  donepezil (ARICEPT) 10 MG tablet Take 10 mg by mouth at bedtime.   Yes Historical Provider, MD  escitalopram (LEXAPRO) 10 MG tablet Take 10 mg by mouth daily.   Yes Historical Provider, MD  fentaNYL (DURAGESIC - DOSED MCG/HR) 100 MCG/HR Place 1 patch (100 mcg total) onto the skin every 3 (three) days. Use with a 50 mcg patch for total dose of 150 mcg Patient taking differently: Place 200 mcg onto the skin every 3 (three) days. 2 patches every 72 hours. 03/16/16  Yes Lloyd Huger, MD  haloperidol (HALDOL) 1 MG tablet Take 1-5 mg by mouth every 6 (six) hours as needed for agitation.   Yes Historical Provider, MD  HYDROmorphone (DILAUDID) 4 MG tablet Take 4-8 mg by mouth every 2 (two) hours as needed for severe pain.   Yes Historical Provider, MD  lisinopril (PRINIVIL,ZESTRIL) 40 MG tablet Take 40 mg by mouth daily.  Yes Historical Provider, MD  LORazepam (ATIVAN) 1 MG tablet TAKE 1 TABLET BY MOUTH EVERY 4 HOURS AS NEEDED FOR ANXIETY AND AGITATION 03/09/16  Yes Historical Provider, MD  morphine (MS CONTIN) 30 MG 12 hr tablet Take 1 tablet (30 mg total) by mouth every 8 (eight) hours. Patient taking differently: Take 45 mg by mouth every 8 (eight) hours.  03/09/16  Yes Lloyd Huger, MD  pantoprazole (PROTONIX) 20 MG tablet Take 20 mg by mouth daily.   Yes Historical Provider, MD  primidone (MYSOLINE) 50 MG tablet Take 150 mg by mouth 2 (two) times daily.    Yes Historical Provider, MD  QUEtiapine (SEROQUEL) 25 MG tablet Take 25 mg by mouth 2 (two)  times daily.   Yes Historical Provider, MD  atorvastatin (LIPITOR) 20 MG tablet Take 40 mg by mouth daily.     Historical Provider, MD  cyanocobalamin 1000 MCG tablet Take 1,000 mcg by mouth daily.    Historical Provider, MD  cyclobenzaprine (FLEXERIL) 10 MG tablet Take 1 tablet (10 mg total) by mouth 3 (three) times daily as needed for muscle spasms. Patient not taking: Reported on 03/21/2016 03/05/16   Lloyd Huger, MD  fentaNYL (DURAGESIC - DOSED MCG/HR) 50 MCG/HR Place 1 patch (50 mcg total) onto the skin every 3 (three) days. Use with a 100 mcg patch for total dose of 150 mcg Patient not taking: Reported on 03/21/2016 03/16/16   Lloyd Huger, MD  Insulin Detemir (LEVEMIR FLEXPEN) 100 UNIT/ML Pen Inject 80 Units into the skin 2 (two) times daily. Sliding scale 60- 80 units daily    Historical Provider, MD  metFORMIN (GLUCOPHAGE) 500 MG tablet Take 500 mg by mouth 2 (two) times daily with a meal.    Historical Provider, MD  Morphine Sulfate (MORPHINE CONCENTRATE) 10 mg / 0.5 ml concentrated solution Take 1 mL (20 mg total) by mouth every 2 (two) hours as needed for severe pain. Patient not taking: Reported on 03/21/2016 03/02/16   Lloyd Huger, MD  Multiple Vitamin (MULTIVITAMIN) tablet Take 1 tablet by mouth daily.    Historical Provider, MD    Allergies Chantix [varenicline]  No family history on file.  Social History Social History  Substance Use Topics  . Smoking status: Current Some Day Smoker  . Smokeless tobacco: Never Used  . Alcohol use No    Review of Systems Constitutional: No fever/chills Eyes: No visual changes. ENT: No sore throat. Cardiovascular: Denies chest pain. Respiratory: Denies shortness of breath. Gastrointestinal: No abdominal pain.   nausea,vomiting.  No diarrhea.  No constipation. Genitourinary: Negative for dysuria. Musculoskeletal: Negative for back pain.   10-point ROS otherwise  negative.  ____________________________________________   PHYSICAL EXAM:  VITAL SIGNS: ED Triage Vitals [03/21/16 1344]  Enc Vitals Group     BP (!) 146/82     Pulse Rate 67     Resp 18     Temp 98.3 F (36.8 C)     Temp Source Oral     SpO2 100 %     Weight 190 lb (86.2 kg)     Height      Head Circumference      Peak Flow      Pain Score 0     Pain Loc      Pain Edu?      Excl. in [place]?     Constitutional: Alert and oriented. Well appearing and in no acute distress. Eyes: Conjunctivae are normal. PERRL. EOMI. Head: Atraumatic.  Nose: No congestion/rhinnorhea. Mouth/Throat: Mucous membranes are moist.  Oropharynx non-erythematous. Neck: No stridor.   Cardiovascular: Normal rate, regular rhythm. Grossly normal heart sounds.  Good peripheral circulation. Respiratory: Normal respiratory effort.  No retractions. Lungs CTAB. Gastrointestinal: Soft and nontender. No distention. No abdominal bruits. No CVA tenderness. Musculoskeletal: No lower extremity tenderness nor edema.  No joint effusions. Neurologic:  Normal speech and language. No gross focal neurologic deficits are appreciated. No gait instability.   ____________________________________________   LABS (all labs ordered are listed, but only abnormal results are displayed)  Labs Reviewed  BASIC METABOLIC PANEL - Abnormal; Notable for the following:       Result Value   Sodium 134 (*)    Chloride 100 (*)    Glucose, Bld 144 (*)    BUN 5 (*)    Creatinine, Ser 0.41 (*)    Calcium 8.7 (*)    All other components within normal limits  CBC - Abnormal; Notable for the following:    WBC 10.7 (*)    RBC 4.32 (*)    Hemoglobin 12.8 (*)    HCT 36.4 (*)    RDW 17.5 (*)    All other components within normal limits  TROPONIN I  TROPONIN I   ____________________________________________  EKG  KG read and interpreted by me shows normal sinus rhythm rate of 68 left axis left bundle-branch  block ____________________________________________  RADIOLOGY   ______Study Result   CLINICAL DATA:  Chest pain  EXAM: CHEST  2 VIEW  COMPARISON:  03/12/2016  FINDINGS: Normal heart size. Esophageal stent is again noted. There is no pleural effusion or edema. No airspace opacities identified.  IMPRESSION: No active cardiopulmonary disease.   Electronically Signed   By: Kerby Moors M.D.   On: 03/21/2016 15:23    ______________________________________   PROCEDURES  Procedure(s) performed:  Procedures  Critical Care performed:  ____________________________________________   INITIAL IMPRESSION / ASSESSMENT AND PLAN / ED COURSE  Pertinent labs & imaging results that were available during my care of the patient were reviewed by me and considered in my medical decision making (see chart for details).     discussed patient with Dr. call wood who will follow him up in the office most likely tomorrow. Patient will return if he is worse. Hospice will take him back now. Patient's pain is currently under control he got pain medicine here.   ____________________________________________   FINAL CLINICAL IMPRESSION(S) / ED DIAGNOSES  Final diagnoses:  Chest pain, unspecified type      NEW MEDICATIONS STARTED DURING THIS VISIT:  New Prescriptions   No medications on file     Note:  This document was prepared using Dragon voice recognition software and may include unintentional dictation errors.    Nena Polio, MD 03/21/16 [phone]

## 2016-03-21 NOTE — Clinical Social Work Note (Signed)
CSW aware this patient is from [facility]. The RN for the weekend, Jackelyn Poling, would like to be called if dc plans are made as he needs to be reassessed for return to hospice facility. Her phone is [phone].  Santiago Bumpers, MSW LCSWA

## 2016-03-21 NOTE — ED Notes (Signed)
Dr. Cinda Quest at bedside.

## 2016-03-21 NOTE — ED Notes (Signed)
Called pharmacy about morphine, they stated it would be ready for pickup in about 15 minutes.

## 2016-03-22 ENCOUNTER — Other Ambulatory Visit: Payer: Self-pay | Admitting: *Deleted

## 2016-03-22 DIAGNOSIS — C7951 Secondary malignant neoplasm of bone: Secondary | ICD-10-CM | POA: Diagnosis not present

## 2016-03-23 ENCOUNTER — Ambulatory Visit

## 2016-03-24 ENCOUNTER — Ambulatory Visit

## 2016-03-25 ENCOUNTER — Ambulatory Visit

## 2016-03-26 ENCOUNTER — Ambulatory Visit

## 2016-03-26 ENCOUNTER — Inpatient Hospital Stay: Payer: BLUE CROSS/BLUE SHIELD

## 2016-04-06 ENCOUNTER — Ambulatory Visit

## 2016-04-14 ENCOUNTER — Other Ambulatory Visit: Payer: Self-pay

## 2016-04-29 DEATH — deceased

## 2018-01-14 IMAGING — CT NM PET TUM IMG RESTAG (PS) SKULL BASE T - THIGH
1 of 10 series · 1 of 25 positions shown · non-contrast
Comparison: None.

CLINICAL DATA: Initial treatment strategy for esophageal carcinoma.

EXAM:
NUCLEAR MEDICINE PET SKULL BASE TO THIGH
TECHNIQUE: 12.2 mCi F-18 FDG was injected intravenously. Full-ring PET imaging
was performed from the skull base to thigh after the radiotracer. CT
data was obtained and used for attenuation correction and anatomic
localization.
FASTING BLOOD GLUCOSE:  Value: 92 mg/dl

[Series 3: ct wb 5.0 b30f · axial · 5.0mm · 0.98mm/px · 1 of 329 slices shown]
[im 329/329  brain]
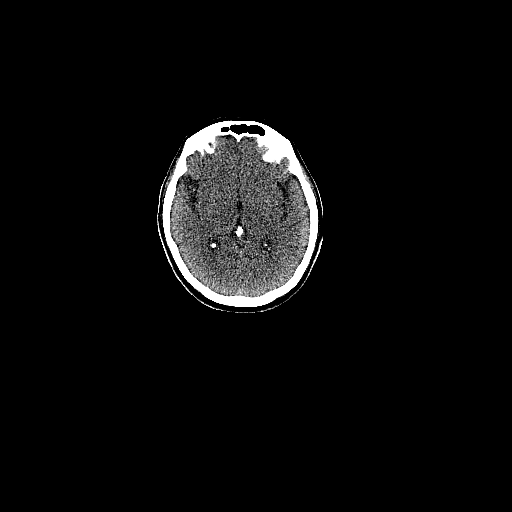

[1 of 25 positions shown; findings below may reference images not displayed]

FINDINGS: NECK

No hypermetabolic lymph nodes in the neck.

CHEST

Esophageal stent seen in place. Hypermetabolic wall thickening of
the distal thoracic esophagus is seen surrounding the distal portion
of the stent. This has an SUV max of 13.0, consistent with primary
esophageal carcinoma.

2 cm Left paraesophageal lymph node is seen near the GE junction,
which has SUV max of 6.5. Other small hypermetabolic mediastinal
lymph nodes are seen in the right cardiophrenic angle and AP window.

Scattered sub-cm bilateral pulmonary nodules are seen on CT images.
The show no hypermetabolic activity but are too small to
definitively characterize by PET. Early bilateral pulmonary
metastases cannot be excluded.

ABDOMEN/PELVIS

Diffuse hypermetabolic liver metastases are seen. Index lesion in
the posterior right hepatic lobe has SUV max of 13.6 on image 129/
3.

Hypermetabolic lymphadenopathy is seen within the gastrohepatic
ligament, porta hepatis, and abdominal retroperitoneum in the
lateral aortic and aortocaval spaces. Index lymph node in the porta
hepatis measures 3.5 cm on image 161/3, with SUV max of 19.0. No
hypermetabolic lymphadenopathy identified within the pelvis.

Two ill-defined soft tissue densities are seen within the anterior
abdominal omental fat which are hypermetabolic. Largest measures
cm on image 205/3, with SUV max of 8.1. No evidence of ascites.

Fluid attenuation cyst along the anterior upper pole the left kidney
shows no hypermetabolic activity. Small low-attenuation bilateral
adrenal masses show no hypermetabolic activity, consistent with
benign adenomas. Small gallstones seen, without evidence of
cholecystitis. Colonic diverticulosis noted, without evidence of
diverticulitis. Moderately enlarged prostate gland with mass effect
on bladder base and mild diffuse bladder wall thickening. Bilateral
fat containing inguinal hernias noted, right side greater than left.

Aortic atherosclerosis.

SKELETON

Hypermetabolic lytic bone metastasis involving the T7 vertebral body
with hypermetabolic soft tissue density in adjacent paraspinal soft
tissues. This has SUV max of 12.7. Other hypermetabolic bone
metastases are seen within the right humeral head, acromion process
of left scapula, left posterior ilium and right pubis.
IMPRESSION: Hypermetabolic distal esophageal mass encasing the esophageal stent,
consistent with primary esophageal carcinoma.

Diffuse hypermetabolic liver metastases.

Metastatic lymphadenopathy within the mediastinum and abdomen.

Small hypermetabolic nodules within the anterior abdominal omental
fat, suspicious for peritoneal metastases.

Hypermetabolic bone metastases.

## 2018-01-23 IMAGING — CR DG CHEST 2V
2 series · 2 of 2 positions shown · non-contrast
Comparison: 03/12/2016

CLINICAL DATA: Chest pain

EXAM:
CHEST  2 VIEW

[chest pa]
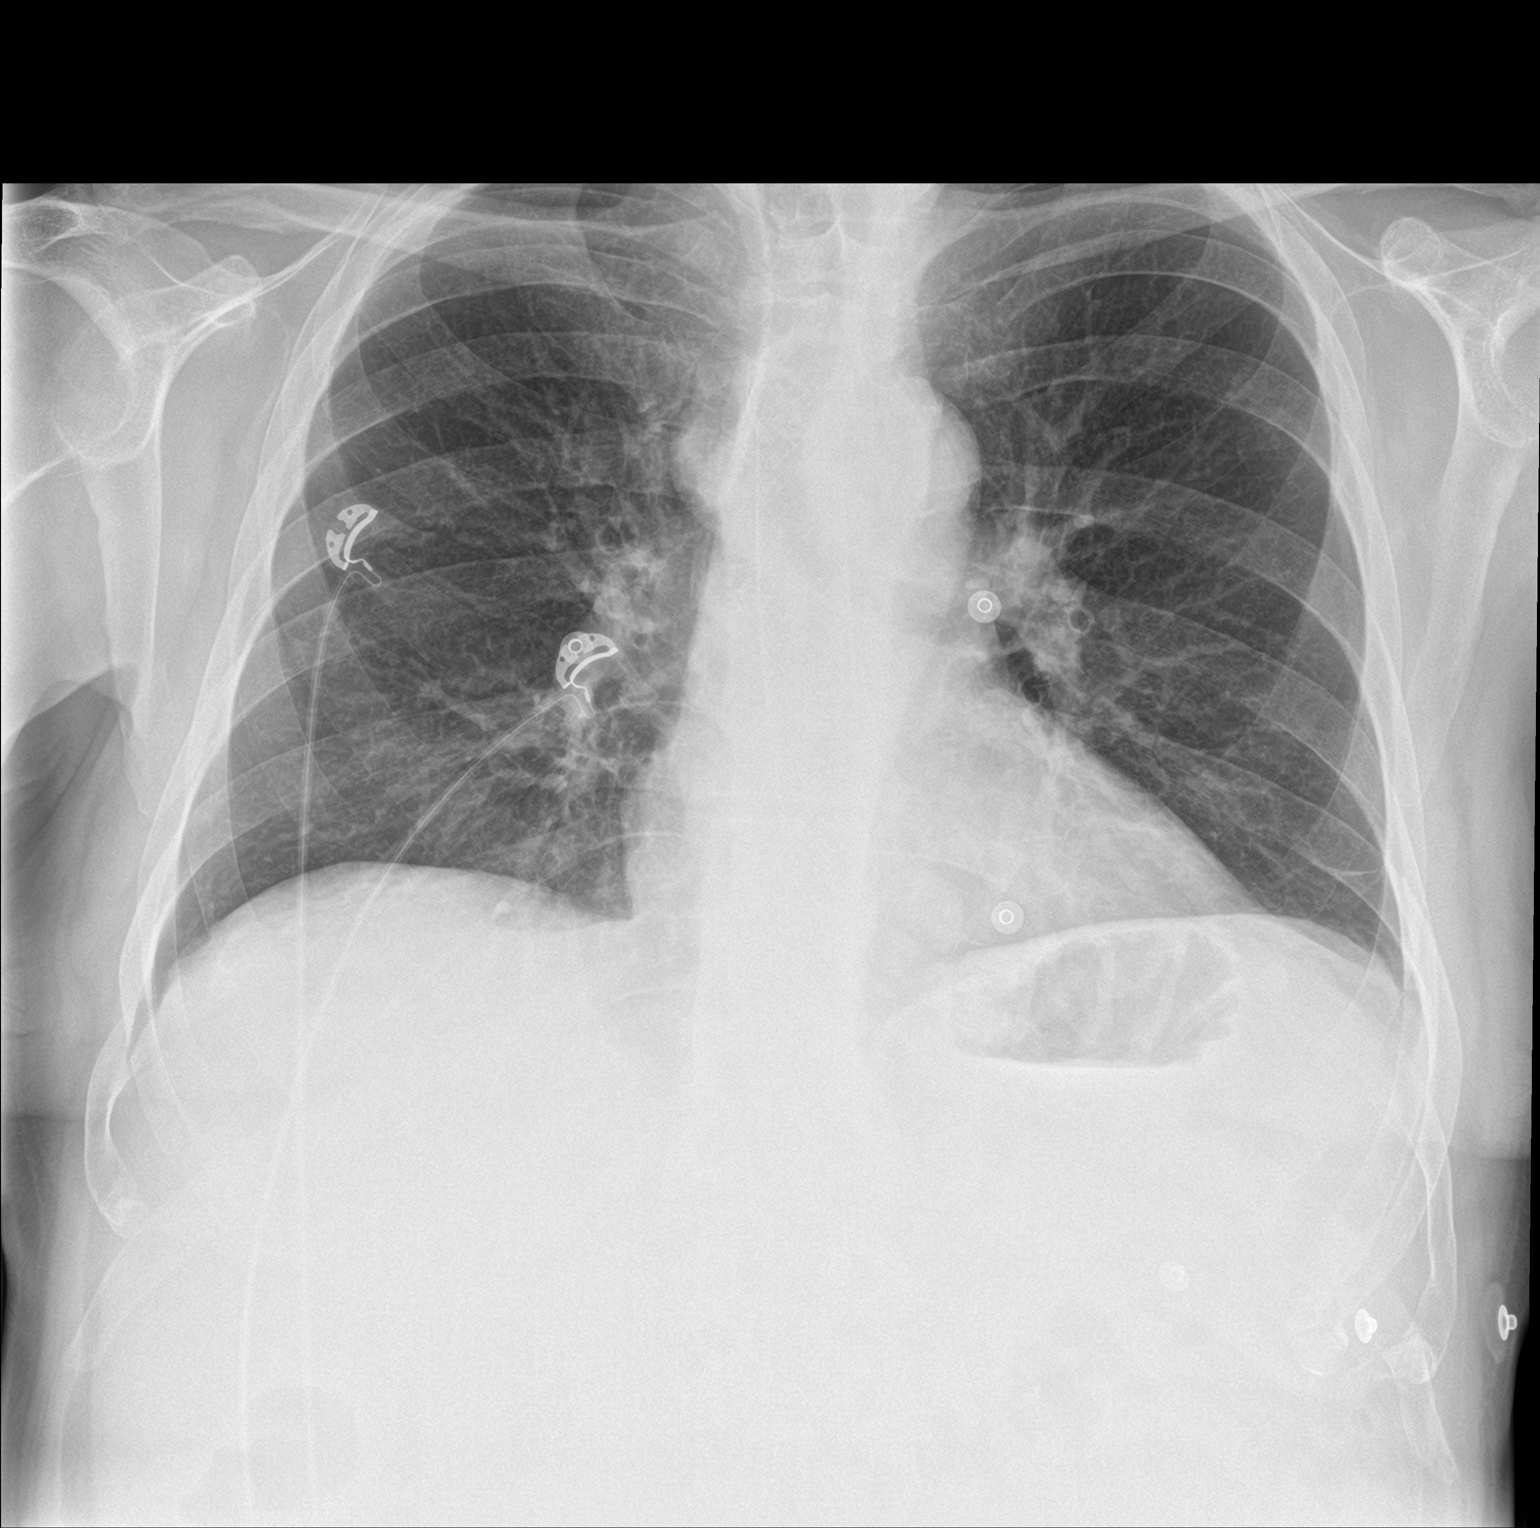

[chest lat]
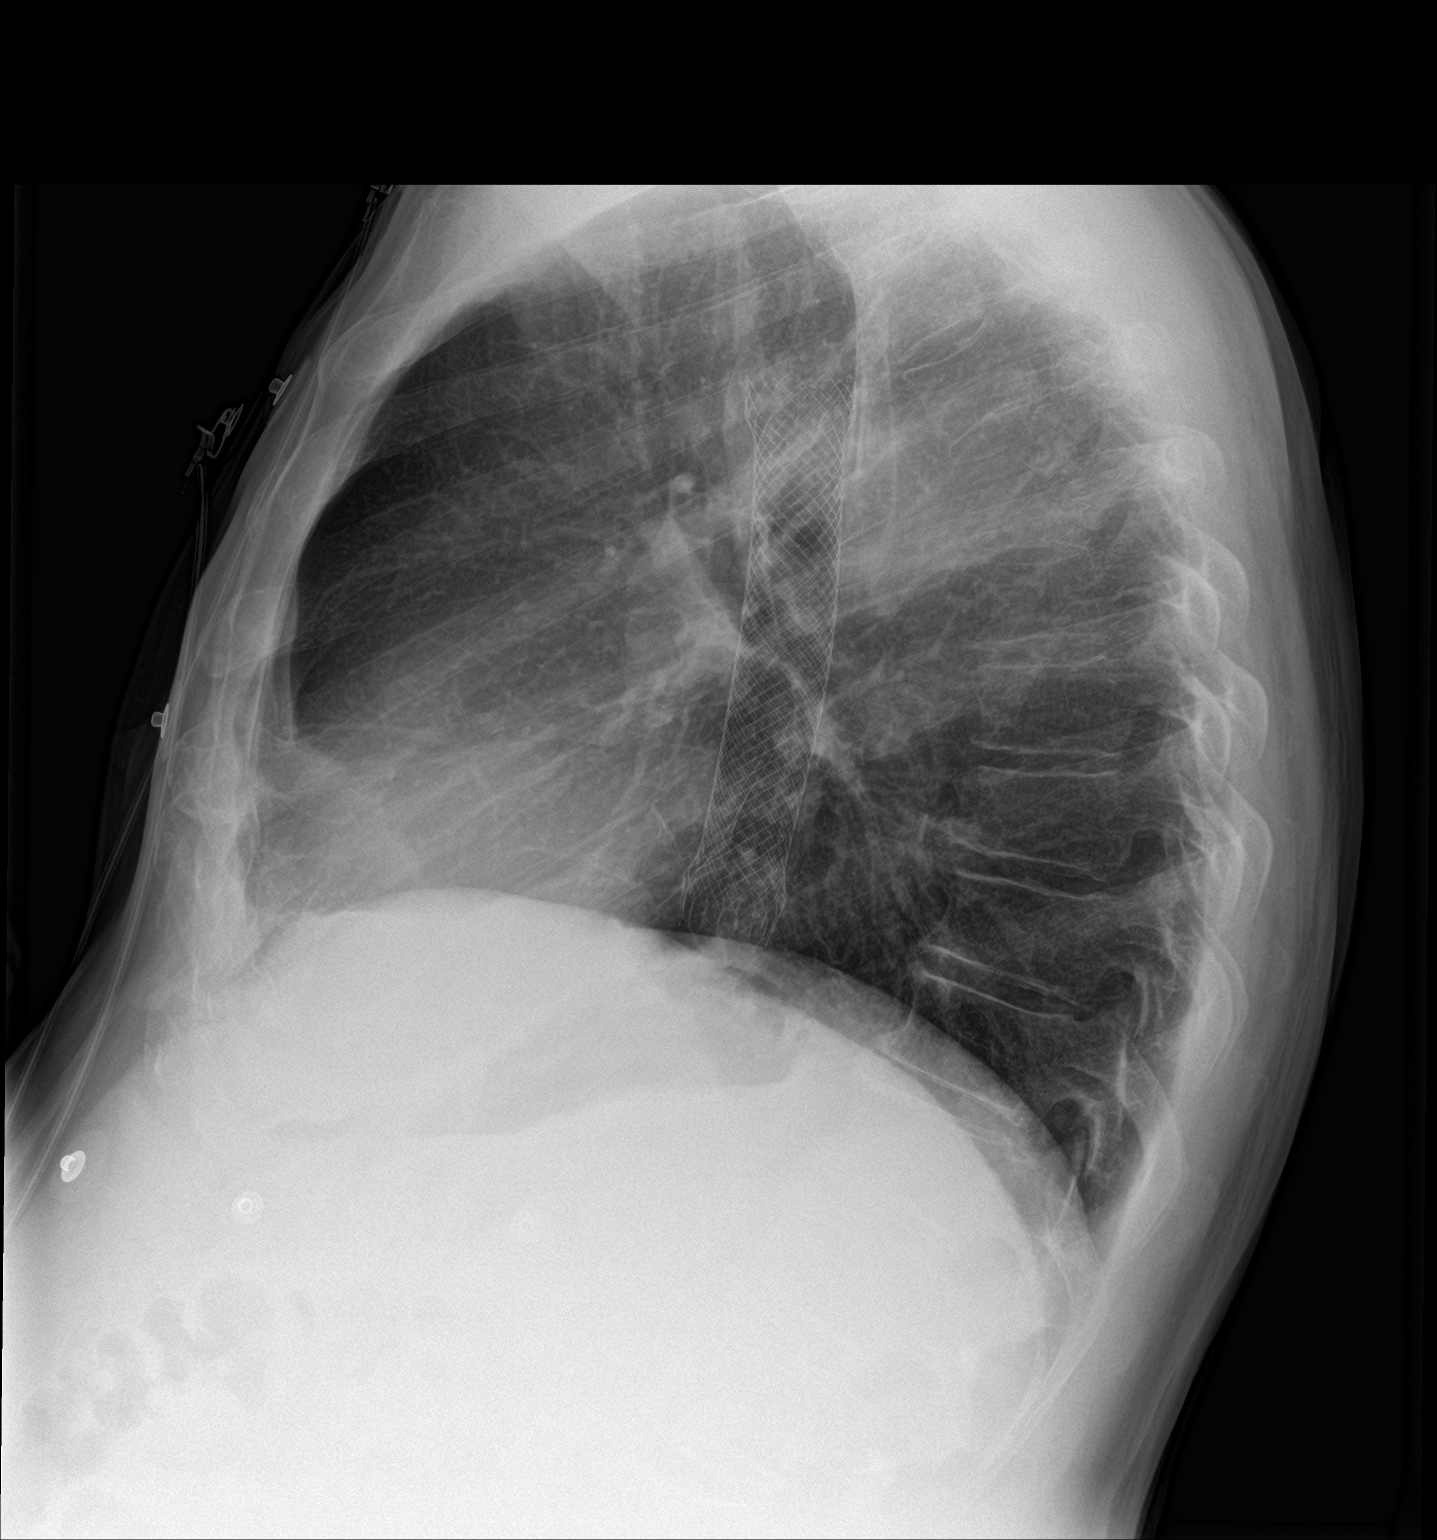

[2 of 2 positions shown; findings below may reference images not displayed]

FINDINGS: Normal heart size. Esophageal stent is again noted. There is no
pleural effusion or edema. No airspace opacities identified.
IMPRESSION: No active cardiopulmonary disease.

## 2022-04-06 ENCOUNTER — Other Ambulatory Visit: Payer: Self-pay
